# Patient Record
Sex: Female | Born: 1965
Health system: Southern US, Community
[De-identification: ages and names within clinical notes are randomized; demographics above are authoritative.]

## PROBLEM LIST (undated history)

## (undated) DIAGNOSIS — E119 Type 2 diabetes mellitus without complications: Secondary | ICD-10-CM

## (undated) DIAGNOSIS — O00109 Unspecified tubal pregnancy without intrauterine pregnancy: Secondary | ICD-10-CM

## (undated) HISTORY — PX: REPAIR NONUNION / DEFECT OF RADIUS / ULNA: SUR1192

## (undated) HISTORY — DX: Type 2 diabetes mellitus without complications: E11.9

## (undated) HISTORY — PX: KNEE ARTHROSCOPY W/ MENISCAL REPAIR: SHX1877

## (undated) HISTORY — PX: SALPINGECTOMY: SHX328

## (undated) HISTORY — DX: Unspecified tubal pregnancy without intrauterine pregnancy: O00.109

---

## 1997-10-12 DIAGNOSIS — O00109 Unspecified tubal pregnancy without intrauterine pregnancy: Secondary | ICD-10-CM

## 1997-10-12 HISTORY — DX: Unspecified tubal pregnancy without intrauterine pregnancy: O00.109

## 2005-12-08 ENCOUNTER — Encounter
Admission: RE | Admit: 2005-12-08 | Discharge: 2005-12-08 | Payer: Self-pay | Admitting: Physical Medicine and Rehabilitation

## 2013-08-15 ENCOUNTER — Encounter: Payer: Self-pay | Admitting: Nurse Practitioner

## 2013-08-15 ENCOUNTER — Other Ambulatory Visit: Payer: Self-pay | Admitting: Nurse Practitioner

## 2013-08-15 ENCOUNTER — Ambulatory Visit (INDEPENDENT_AMBULATORY_CARE_PROVIDER_SITE_OTHER): Payer: Self-pay | Admitting: Nurse Practitioner

## 2013-08-15 VITALS — BP 104/80 | HR 86 | Temp 97.8°F | Ht 63.0 in | Wt 174.2 lb

## 2013-08-15 DIAGNOSIS — Z0000 Encounter for general adult medical examination without abnormal findings: Secondary | ICD-10-CM

## 2013-08-15 DIAGNOSIS — E119 Type 2 diabetes mellitus without complications: Secondary | ICD-10-CM

## 2013-08-15 DIAGNOSIS — E1110 Type 2 diabetes mellitus with ketoacidosis without coma: Secondary | ICD-10-CM

## 2013-08-15 MED ORDER — METFORMIN HCL 1000 MG PO TABS: 1000.0000 mg | ORAL_TABLET | Freq: Two times a day (BID) | ORAL | Status: DC

## 2013-08-15 NOTE — Progress Notes (Signed)
Subjective:     Rose Price is a 47 y.o. female and is here for a comprehensive physical exam. The patient reports recent diagnosis of diabetes. She has been on metformin for about 8-12 weeks.Marland Kitchen  History   Social History  . Marital Status: Married    Spouse Name: N/A    Number of Children: 2  . Years of Education: N/A   Occupational History  . Phelbotomist    Social History Main Topics  . Smoking status: Never Smoker   . Smokeless tobacco: Never Used  . Alcohol Use: No  . Drug Use: No  . Sexual Activity: Yes   Other Topics Concern  . Not on file   Social History Narrative  . No narrative on file   No health maintenance topics applied.  The following portions of the patient's history were reviewed and updated as appropriate: allergies, current medications, past family history, past medical history, past social history, past surgical history and problem list.  Review of Systems Constitutional: negative Eyes: negative Ears, nose, mouth, throat, and face: negative Respiratory: negative Cardiovascular: negative Gastrointestinal: negative Genitourinary:missed 2 MC 6 mos ago. Mc have been regular since then. having perimenopausal symptoms such as hot flashes. Integument/breast: negative Musculoskeletal:negative Neurological: negative Behavioral/Psych: positive for sleep disturbance and takes 30 mg melatonin times 1 yr. Took ambien in past-has memory loss. Endocrine: negative for diabetic symptoms including blurry vision, polydipsia, polyphagia, polyuria and poor wound healing and temperature intolerance Allergic/Immunologic: negative   Objective:    BP 104/80  Pulse 86  Temp(Src) 97.8 F (36.6 C) (Oral)  Ht 5\' 3"  (1.6 m)  Wt 174 lb 4 oz (79.039 kg)  BMI 30.87 kg/m2  SpO2 98%  LMP 08/13/2013 General appearance: alert, cooperative, appears stated age and no distress Head: Normocephalic, without obvious abnormality, atraumatic Eyes: negative findings: lids and  lashes normal, conjunctivae and sclerae normal, corneas clear and pupils equal, round, reactive to light and accomodation Ears: normal TM's and external ear canals both ears Nose: Nares normal. Septum midline. Mucosa normal. No drainage or sinus tenderness. Throat: lips, mucosa, and tongue normal; teeth and gums normal Neck: no adenopathy, no carotid bruit, supple, symmetrical, trachea midline and thyroid not enlarged, symmetric, no tenderness/mass/nodules Lungs: clear to auscultation bilaterally Heart: regular rate and rhythm, S1, S2 normal, no murmur, click, rub or gallop Abdomen: soft, non-tender; bowel sounds normal; no masses,  no organomegaly Extremities: extremities normal, atraumatic, no cyanosis or edema, no edema, redness or tenderness in the calves or thighs, no ulcers, gangrene or trophic changes and full sensation in feet using monofilament Pulses: 2+ and symmetric Skin: Skin color, texture, turgor normal. No rashes or lesions or hsa solar lentigo epigastric region, center. Pt used to use tanning beds a lot, uses spray tans from time to time. Lymph nodes: Cervical, supraclavicular, and axillary nodes normal. Neurologic: Alert and oriented X 3, normal strength and tone. Normal symmetric reflexes. Normal coordination and gait    Assessment:    Healthy female exam. prev care: tdap 2013, declined flu & pneumococcal vaccine, Pap next yr-no nmls, last was 2013. New diagnosis of diabetes. Currently on metformin 1000 mg bid. Has made diet changes. INsomnia-using 30 mg melatonin qhs.       Plan:  Pap in 2016. Educated regarding need for vaccines in people w/chronic illness such as diabetes. Limit screening labs as pt does not have ins now. Diabetes: Continue metformin. HgbA1C, AST/ALT, Bmet. Will do lipids & microalb next visit. Gave info for s&s hypo &  hyperglycemia w/interventions, info for free classes thru [facility] [place] Insomnia- cautioned high dose melatonin, discussed realistic  expectations of sleep.    See After Visit Summary for Counseling Recommendations

## 2013-08-15 NOTE — Patient Instructions (Addendum)
Our office will call with lab results. Nice to see you!  Preventive Care for Adults, Female A healthy lifestyle and preventive care can promote health and wellness. Preventive health guidelines for women include the following key practices.  A routine yearly physical is a good way to check with your caregiver about your health and preventive screening. It is a chance to share any concerns and updates on your health, and to receive a thorough exam.  Visit your dentist for a routine exam and preventive care every 6 months. Brush your teeth twice a day and floss once a day. Good oral hygiene prevents tooth decay and gum disease.  The frequency of eye exams is based on your age, health, family medical history, use of contact lenses, and other factors. Follow your caregiver's recommendations for frequency of eye exams.  Eat a healthy diet. Foods like vegetables, fruits, whole grains, low-fat dairy products, and lean protein foods contain the nutrients you need without too many calories. Decrease your intake of foods high in solid fats, added sugars, and salt. Eat the right amount of calories for you.Get information about a proper diet from your caregiver, if necessary.  Regular physical exercise is one of the most important things you can do for your health. Most adults should get at least 150 minutes of moderate-intensity exercise (any activity that increases your heart rate and causes you to sweat) each week. In addition, most adults need muscle-strengthening exercises on 2 or more days a week.  Maintain a healthy weight. The body mass index (BMI) is a screening tool to identify possible weight problems. It provides an estimate of body fat based on height and weight. Your caregiver can help determine your BMI, and can help you achieve or maintain a healthy weight.For adults 20 years and older:  A BMI below 18.5 is considered underweight.  A BMI of 18.5 to 24.9 is normal.  A BMI of 25 to 29.9 is  considered overweight.  A BMI of 30 and above is considered obese.  Maintain normal blood lipids and cholesterol levels by exercising and minimizing your intake of saturated fat. Eat a balanced diet with plenty of fruit and vegetables. Blood tests for lipids and cholesterol should begin at age 75 and be repeated every 5 years. If your lipid or cholesterol levels are high, you are over 50, or you are at high risk for heart disease, you may need your cholesterol levels checked more frequently.Ongoing high lipid and cholesterol levels should be treated with medicines if diet and exercise are not effective.  If you smoke, find out from your caregiver how to quit. If you do not use tobacco, do not start.  If you are pregnant, do not drink alcohol. If you are breastfeeding, be very cautious about drinking alcohol. If you are not pregnant and choose to drink alcohol, do not exceed 1 drink per day. One drink is considered to be 12 ounces (355 mL) of beer, 5 ounces (148 mL) of wine, or 1.5 ounces (44 mL) of liquor.  Avoid use of street drugs. Do not share needles with anyone. Ask for help if you need support or instructions about stopping the use of drugs.  High blood pressure causes heart disease and increases the risk of stroke. Your blood pressure should be checked at least every 1 to 2 years. Ongoing high blood pressure should be treated with medicines if weight loss and exercise are not effective.  If you are 59 to 49 years old, ask  your caregiver if you should take aspirin to prevent strokes.  Diabetes screening involves taking a blood sample to check your fasting blood sugar level. This should be done once every 3 years, after age 81, if you are within normal weight and without risk factors for diabetes. Testing should be considered at a younger age or be carried out more frequently if you are overweight and have at least 1 risk factor for diabetes.  Breast cancer screening is essential preventive  care for women. You should practice "breast self-awareness." This means understanding the normal appearance and feel of your breasts and may include breast self-examination. Any changes detected, no matter how small, should be reported to a caregiver. Women in their 70s and 30s should have a clinical breast exam (CBE) by a caregiver as part of a regular health exam every 1 to 3 years. After age 44, women should have a CBE every year. Starting at age 30, women should consider having a mammography (breast X-ray test) every year. Women who have a family history of breast cancer should talk to their caregiver about genetic screening. Women at a high risk of breast cancer should talk to their caregivers about having magnetic resonance imaging (MRI) and a mammography every year.  The Pap test is a screening test for cervical cancer. A Pap test can show cell changes on the cervix that might become cervical cancer if left untreated. A Pap test is a procedure in which cells are obtained and examined from the lower end of the uterus (cervix).  Women should have a Pap test starting at age 13.  Between ages 20 and 87, Pap tests should be repeated every 2 years.  Beginning at age 21, you should have a Pap test every 3 years as long as the past 3 Pap tests have been normal.  Some women have medical problems that increase the chance of getting cervical cancer. Talk to your caregiver about these problems. It is especially important to talk to your caregiver if a new problem develops soon after your last Pap test. In these cases, your caregiver may recommend more frequent screening and Pap tests.  The above recommendations are the same for women who have or have not gotten the vaccine for human papillomavirus (HPV).  If you had a hysterectomy for a problem that was not cancer or a condition that could lead to cancer, then you no longer need Pap tests. Even if you no longer need a Pap test, a regular exam is a good idea  to make sure no other problems are starting.  If you are between ages 51 and 63, and you have had normal Pap tests going back 10 years, you no longer need Pap tests. Even if you no longer need a Pap test, a regular exam is a good idea to make sure no other problems are starting.  If you have had past treatment for cervical cancer or a condition that could lead to cancer, you need Pap tests and screening for cancer for at least 20 years after your treatment.  If Pap tests have been discontinued, risk factors (such as a new sexual partner) need to be reassessed to determine if screening should be resumed.  The HPV test is an additional test that may be used for cervical cancer screening. The HPV test looks for the virus that can cause the cell changes on the cervix. The cells collected during the Pap test can be tested for HPV. The HPV test could  be used to screen women aged 12 years and older, and should be used in women of any age who have unclear Pap test results. After the age of 54, women should have HPV testing at the same frequency as a Pap test.  Colorectal cancer can be detected and often prevented. Most routine colorectal cancer screening begins at the age of 62 and continues through age 47. However, your caregiver may recommend screening at an earlier age if you have risk factors for colon cancer. On a yearly basis, your caregiver may provide home test kits to check for hidden blood in the stool. Use of a small camera at the end of a tube, to directly examine the colon (sigmoidoscopy or colonoscopy), can detect the earliest forms of colorectal cancer. Talk to your caregiver about this at age 5, when routine screening begins. Direct examination of the colon should be repeated every 5 to 10 years through age 81, unless early forms of pre-cancerous polyps or small growths are found.  Hepatitis C blood testing is recommended for all people born from [birth year] through 1965 and any individual with known  risks for hepatitis C.  Practice safe sex. Use condoms and avoid high-risk sexual practices to reduce the spread of sexually transmitted infections (STIs). STIs include gonorrhea, chlamydia, syphilis, trichomonas, herpes, HPV, and human immunodeficiency virus (HIV). Herpes, HIV, and HPV are viral illnesses that have no cure. They can result in disability, cancer, and death. Sexually active women aged 30 and younger should be checked for chlamydia. Older women with new or multiple partners should also be tested for chlamydia. Testing for other STIs is recommended if you are sexually active and at increased risk.  Osteoporosis is a disease in which the bones lose minerals and strength with aging. This can result in serious bone fractures. The risk of osteoporosis can be identified using a bone density scan. Women ages 45 and over and women at risk for fractures or osteoporosis should discuss screening with their caregivers. Ask your caregiver whether you should take a calcium supplement or vitamin D to reduce the rate of osteoporosis.  Menopause can be associated with physical symptoms and risks. Hormone replacement therapy is available to decrease symptoms and risks. You should talk to your caregiver about whether hormone replacement therapy is right for you.  Use sunscreen with sun protection factor (SPF) of 30 or more. Apply sunscreen liberally and repeatedly throughout the day. You should seek shade when your shadow is shorter than you. Protect yourself by wearing long sleeves, pants, a wide-brimmed hat, and sunglasses year round, whenever you are outdoors.  Once a month, do a whole body skin exam, using a mirror to look at the skin on your back. Notify your caregiver of new moles, moles that have irregular borders, moles that are larger than a pencil eraser, or moles that have changed in shape or color.  Stay current with required immunizations.  Influenza. You need a dose every fall (or winter).  The composition of the flu vaccine changes each year, so being vaccinated once is not enough.  Pneumococcal polysaccharide. You need 1 to 2 doses if you smoke cigarettes or if you have certain chronic medical conditions. You need 1 dose at age 46 (or older) if you have never been vaccinated.  Tetanus, diphtheria, pertussis (Tdap, Td). Get 1 dose of Tdap vaccine if you are younger than age 58, are over 104 and have contact with an infant, are a Research scientist (physical sciences), are pregnant, or simply want  to be protected from whooping cough. After that, you need a Td booster dose every 10 years. Consult your caregiver if you have not had at least 3 tetanus and diphtheria-containing shots sometime in your life or have a deep or dirty wound.  HPV. You need this vaccine if you are a woman age 58 or younger. The vaccine is given in 3 doses over 6 months.  Measles, mumps, rubella (MMR). You need at least 1 dose of MMR if you were born in 1957 or later. You may also need a second dose.  Meningococcal. If you are age 25 to 38 and a first-year college student living in a residence hall, or have one of several medical conditions, you need to get vaccinated against meningococcal disease. You may also need additional booster doses.  Zoster (shingles). If you are age 37 or older, you should get this vaccine.  Varicella (chickenpox). If you have never had chickenpox or you were vaccinated but received only 1 dose, talk to your caregiver to find out if you need this vaccine.  Hepatitis A. You need this vaccine if you have a specific risk factor for hepatitis A virus infection or you simply wish to be protected from this disease. The vaccine is usually given as 2 doses, 6 to 18 months apart.  Hepatitis B. You need this vaccine if you have a specific risk factor for hepatitis B virus infection or you simply wish to be protected from this disease. The vaccine is given in 3 doses, usually over 6 months. Preventive Services /  Frequency Ages 58 to 56  Blood pressure check.** / Every 1 to 2 years.  Lipid and cholesterol check.** / Every 5 years beginning at age 12.  Clinical breast exam.** / Every 3 years for women in their 62s and 30s.  Pap test.** / Every 2 years from ages 39 through 44. Every 3 years starting at age 66 through age 34 or 50 with a history of 3 consecutive normal Pap tests.  HPV screening.** / Every 3 years from ages 70 through ages 40 to 15 with a history of 3 consecutive normal Pap tests.  Hepatitis C blood test.** / For any individual with known risks for hepatitis C.  Skin self-exam. / Monthly.  Influenza immunization.** / Every year.  Pneumococcal polysaccharide immunization.** / 1 to 2 doses if you smoke cigarettes or if you have certain chronic medical conditions.  Tetanus, diphtheria, pertussis (Tdap, Td) immunization. / A one-time dose of Tdap vaccine. After that, you need a Td booster dose every 10 years.  HPV immunization. / 3 doses over 6 months, if you are 40 and younger.  Measles, mumps, rubella (MMR) immunization. / You need at least 1 dose of MMR if you were born in 1957 or later. You may also need a second dose.  Meningococcal immunization. / 1 dose if you are age 35 to 8 and a first-year college student living in a residence hall, or have one of several medical conditions, you need to get vaccinated against meningococcal disease. You may also need additional booster doses.  Varicella immunization.** / Consult your caregiver.  Hepatitis A immunization.** / Consult your caregiver. 2 doses, 6 to 18 months apart.  Hepatitis B immunization.** / Consult your caregiver. 3 doses usually over 6 months. Ages 35 to 72  Blood pressure check.** / Every 1 to 2 years.  Lipid and cholesterol check.** / Every 5 years beginning at age 100.  Clinical breast exam.** / Every year  after age 79.  Mammogram.** / Every year beginning at age 77 and continuing for as long as you are in  good health. Consult with your caregiver.  Pap test.** / Every 3 years starting at age 79 through age 36 or 57 with a history of 3 consecutive normal Pap tests.  HPV screening.** / Every 3 years from ages 16 through ages 69 to 35 with a history of 3 consecutive normal Pap tests.  Fecal occult blood test (FOBT) of stool. / Every year beginning at age 57 and continuing until age 34. You may not need to do this test if you get a colonoscopy every 10 years.  Flexible sigmoidoscopy or colonoscopy.** / Every 5 years for a flexible sigmoidoscopy or every 10 years for a colonoscopy beginning at age 3 and continuing until age 22.  Hepatitis C blood test.** / For all people born from [birth year] through 1967 and any individual with known risks for hepatitis C.  Skin self-exam. / Monthly.  Influenza immunization.** / Every year.  Pneumococcal polysaccharide immunization.** / 1 to 2 doses if you smoke cigarettes or if you have certain chronic medical conditions.  Tetanus, diphtheria, pertussis (Tdap, Td) immunization.** / A one-time dose of Tdap vaccine. After that, you need a Td booster dose every 10 years.  Measles, mumps, rubella (MMR) immunization. / You need at least 1 dose of MMR if you were born in 1957 or later. You may also need a second dose.  Varicella immunization.** / Consult your caregiver.  Meningococcal immunization.** / Consult your caregiver.  Hepatitis A immunization.** / Consult your caregiver. 2 doses, 6 to 18 months apart.  Hepatitis B immunization.** / Consult your caregiver. 3 doses, usually over 6 months. Ages 50 and over  Blood pressure check.** / Every 1 to 2 years.  Lipid and cholesterol check.** / Every 5 years beginning at age 57.  Clinical breast exam.** / Every year after age 10.  Mammogram.** / Every year beginning at age 65 and continuing for as long as you are in good health. Consult with your caregiver.  Pap test.** / Every 3 years starting at age 3 through  age 52 or 64 with a 3 consecutive normal Pap tests. Testing can be stopped between 65 and 70 with 3 consecutive normal Pap tests and no abnormal Pap or HPV tests in the past 10 years.  HPV screening.** / Every 3 years from ages 28 through ages 12 or 61 with a history of 3 consecutive normal Pap tests. Testing can be stopped between 65 and 70 with 3 consecutive normal Pap tests and no abnormal Pap or HPV tests in the past 10 years.  Fecal occult blood test (FOBT) of stool. / Every year beginning at age 31 and continuing until age 51. You may not need to do this test if you get a colonoscopy every 10 years.  Flexible sigmoidoscopy or colonoscopy.** / Every 5 years for a flexible sigmoidoscopy or every 10 years for a colonoscopy beginning at age 64 and continuing until age 109.  Hepatitis C blood test.** / For all people born from [birth year] through 1967 and any individual with known risks for hepatitis C.  Osteoporosis screening.** / A one-time screening for women ages 25 and over and women at risk for fractures or osteoporosis.  Skin self-exam. / Monthly.  Influenza immunization.** / Every year.  Pneumococcal polysaccharide immunization.** / 1 dose at age 20 (or older) if you have never been vaccinated.  Tetanus, diphtheria, pertussis (Tdap, Td)  immunization. / A one-time dose of Tdap vaccine if you are over 65 and have contact with an infant, are a Research scientist (physical sciences), or simply want to be protected from whooping cough. After that, you need a Td booster dose every 10 years.  Varicella immunization.** / Consult your caregiver.  Meningococcal immunization.** / Consult your caregiver.  Hepatitis A immunization.** / Consult your caregiver. 2 doses, 6 to 18 months apart.  Hepatitis B immunization.** / Check with your caregiver. 3 doses, usually over 6 months. ** Family history and personal history of risk and conditions may change your caregiver's recommendations. Document Released: 11/24/2001 Document  Revised: 12/21/2011 Document Reviewed: 02/23/2011 [facility] Patient Information 2014 [place], [place].   Type 2 Diabetes Mellitus, Adult Type 2 diabetes mellitus, often simply referred to as type 2 diabetes, is a long-lasting (chronic) disease. In type 2 diabetes, the pancreas does not make enough insulin (a hormone), the cells are less responsive to the insulin that is made (insulin resistance), or both. Normally, insulin moves sugars from food into the tissue cells. The tissue cells use the sugars for energy. The lack of insulin or the lack of normal response to insulin causes excess sugars to build up in the blood instead of going into the tissue cells. As a result, high blood sugar (hyperglycemia) develops. The effect of high sugar (glucose) levels can cause many complications. Type 2 diabetes was also previously called adult-onset diabetes but it can occur at any age.  RISK FACTORS  A person is predisposed to developing type 2 diabetes if someone in the family has the disease and also has one or more of the following primary risk factors:  Overweight.  An inactive lifestyle.  A history of consistently eating high-calorie foods. Maintaining a normal weight and regular physical activity can reduce the chance of developing type 2 diabetes. SYMPTOMS  A person with type 2 diabetes may not show symptoms initially. The symptoms of type 2 diabetes appear slowly. The symptoms include:  Increased thirst (polydipsia).  Increased urination (polyuria).  Increased urination during the night (nocturia).  Weight loss. This weight loss may be rapid.  Frequent, recurring infections.  Tiredness (fatigue).  Weakness.  Vision changes, such as blurred vision.  Fruity smell to your breath.  Abdominal pain.  Nausea or vomiting.  Cuts or bruises which are slow to heal.  Tingling or numbness in the hands or feet. DIAGNOSIS Type 2 diabetes is frequently not diagnosed until complications of  diabetes are present. Type 2 diabetes is diagnosed when symptoms or complications are present and when blood glucose levels are increased. Your blood glucose level may be checked by one or more of the following blood tests:  A fasting blood glucose test. You will not be allowed to eat for at least 8 hours before a blood sample is taken.  A random blood glucose test. Your blood glucose is checked at any time of the day regardless of when you ate.  A hemoglobin A1c blood glucose test. A hemoglobin A1c test provides information about blood glucose control over the previous 3 months.  An oral glucose tolerance test (OGTT). Your blood glucose is measured after you have not eaten (fasted) for 2 hours and then after you drink a glucose-containing beverage. TREATMENT   You may need to take insulin or diabetes medicine daily to keep blood glucose levels in the desired range.  You will need to match insulin dosing with exercise and healthy food choices. The treatment goal is to maintain the before  meal blood sugar (preprandial glucose) level at 70 130 mg/dL. HOME CARE INSTRUCTIONS   Have your hemoglobin A1c level checked twice a year.  Perform daily blood glucose monitoring as directed by your caregiver.  Monitor urine ketones when you are ill and as directed by your caregiver.  Take your diabetes medicine or insulin as directed by your caregiver to maintain your blood glucose levels in the desired range.  Never run out of diabetes medicine or insulin. It is needed every day.  Adjust insulin based on your intake of carbohydrates. Carbohydrates can raise blood glucose levels but need to be included in your diet. Carbohydrates provide vitamins, minerals, and fiber which are an essential part of a healthy diet. Carbohydrates are found in fruits, vegetables, whole grains, dairy products, legumes, and foods containing added sugars.    Eat healthy foods. Alternate 3 meals with 3 snacks.  Lose weight  if overweight.  Carry a medical alert card or wear your medical alert jewelry.  Carry a 15 gram carbohydrate snack with you at all times to treat low blood glucose (hypoglycemia). Some examples of 15 gram carbohydrate snacks include:  Glucose tablets, 3 or 4   Glucose gel, 15 gram tube  Raisins, 2 tablespoons (24 grams)  Jelly beans, 6  Animal crackers, 8  Regular pop, 4 ounces (120 mL)  Gummy treats, 9  Recognize hypoglycemia. Hypoglycemia occurs with blood glucose levels of 70 mg/dL and below. The risk for hypoglycemia increases when fasting or skipping meals, during or after intense exercise, and during sleep. Hypoglycemia symptoms can include:  Tremors or shakes.  Decreased ability to concentrate.  Sweating.  Increased heart rate.  Headache.  Dry mouth.  Hunger.  Irritability.  Anxiety.  Restless sleep.  Altered speech or coordination.  Confusion.  Treat hypoglycemia promptly. If you are alert and able to safely swallow, follow the 15:15 rule:  Take 15 20 grams of rapid-acting glucose or carbohydrate. Rapid-acting options include glucose gel, glucose tablets, or 4 ounces (120 mL) of fruit juice, regular soda, or low fat milk.  Check your blood glucose level 15 minutes after taking the glucose.  Take 15 20 grams more of glucose if the repeat blood glucose level is still 70 mg/dL or below.  Eat a meal or snack within 1 hour once blood glucose levels return to normal.    Be alert to polyuria and polydipsia which are early signs of hyperglycemia. An early awareness of hyperglycemia allows for prompt treatment. Treat hyperglycemia as directed by your caregiver.  Engage in at least 150 minutes of moderate-intensity physical activity a week, spread over at least 3 days of the week or as directed by your caregiver. In addition, you should engage in resistance exercise at least 2 times a week or as directed by your caregiver.  Adjust your medicine and food  intake as needed if you start a new exercise or sport.  Follow your sick day plan at any time you are unable to eat or drink as usual.  Avoid tobacco use.  Limit alcohol intake to no more than 1 drink per day for nonpregnant women and 2 drinks per day for men. You should drink alcohol only when you are also eating food. Talk with your caregiver whether alcohol is safe for you. Tell your caregiver if you drink alcohol several times a week.  Follow up with your caregiver regularly.  Schedule an eye exam soon after the diagnosis of type 2 diabetes and then annually.  Perform daily  skin and foot care. Examine your skin and feet daily for cuts, bruises, redness, nail problems, bleeding, blisters, or sores. A foot exam by a caregiver should be done annually.  Brush your teeth and gums at least twice a day and floss at least once a day. Follow up with your dentist regularly.  Share your diabetes management plan with your workplace or school.  Stay up-to-date with immunizations.  Learn to manage stress.  Obtain ongoing diabetes education and support as needed.  Participate in, or seek rehabilitation as needed to maintain or improve independence and quality of life. Request a physical or occupational therapy referral if you are having foot or hand numbness or difficulties with grooming, dressing, eating, or physical activity. SEEK MEDICAL CARE IF:   You are unable to eat food or drink fluids for more than 6 hours.  You have nausea and vomiting for more than 6 hours.  Your blood glucose level is over 240 mg/dL.  There is a change in mental status.  You develop an additional serious illness.  You have diarrhea for more than 6 hours.  You have been sick or have had a fever for a couple of days and are not getting better.  You have pain during any physical activity.  SEEK IMMEDIATE MEDICAL CARE IF:  You have difficulty breathing.  You have moderate to large ketone levels. MAKE SURE  YOU:  Understand these instructions.  Will watch your condition.  Will get help right away if you are not doing well or get worse. Document Released: 09/28/2005 Document Revised: 06/22/2012 Document Reviewed: 04/26/2012 [facility] Patient Information 2014 [place], [place].

## 2013-08-16 ENCOUNTER — Other Ambulatory Visit: Payer: Self-pay | Admitting: Nurse Practitioner

## 2013-08-16 DIAGNOSIS — E1110 Type 2 diabetes mellitus with ketoacidosis without coma: Secondary | ICD-10-CM

## 2013-08-16 LAB — BASIC METABOLIC PANEL
Creat: 0.74 mg/dL (ref 0.50–1.10)
Glucose, Bld: 136 mg/dL — ABNORMAL HIGH (ref 70–99)
Potassium: 4.2 mEq/L (ref 3.5–5.3)
Sodium: 135 mEq/L (ref 135–145)

## 2013-08-16 LAB — HEMOGLOBIN A1C
Hgb A1c MFr Bld: 6.9 % — ABNORMAL HIGH (ref <5.7)
Mean Plasma Glucose: 151 mg/dL — ABNORMAL HIGH (ref <117)

## 2013-08-16 LAB — AST: AST: 15 U/L (ref 0–37)

## 2013-08-16 MED ORDER — METFORMIN HCL 500 MG PO TABS: 500.0000 mg | ORAL_TABLET | Freq: Two times a day (BID) | ORAL | Status: DC

## 2013-08-21 ENCOUNTER — Telehealth: Payer: Self-pay | Admitting: *Deleted

## 2013-08-21 ENCOUNTER — Telehealth: Payer: Self-pay | Admitting: Nurse Practitioner

## 2013-08-21 DIAGNOSIS — E1110 Type 2 diabetes mellitus with ketoacidosis without coma: Secondary | ICD-10-CM

## 2013-08-21 NOTE — Telephone Encounter (Signed)
Pt called requesting a return call from [employer].  Please advise

## 2013-08-21 NOTE — Telephone Encounter (Signed)
Hgb A1c much improved, will decrease metformin. LM for pt X 2.

## 2013-08-22 MED ORDER — METFORMIN HCL 1000 MG PO TABS: 1000.0000 mg | ORAL_TABLET | Freq: Two times a day (BID) | ORAL | Status: DC

## 2013-08-22 NOTE — Telephone Encounter (Signed)
HgbA1c dramatically improved w/metformin 1000mg  bid & diest changes. Will continue w/current Tx for 3 more months, then re-eval. Discussed w/pt. All questions answered.

## 2013-09-25 ENCOUNTER — Telehealth: Payer: Self-pay | Admitting: *Deleted

## 2013-09-25 NOTE — Telephone Encounter (Signed)
Patient is requesting a medication for her nerves(Xanax). Patient stated that due to her living situation her nerves stay tore-up. Patient stated that she just wants something to get her past the holidays or until she moves out in two months. I explained that she would probably have to be seen in the office and patient said that she can not afford to an ov. Please advise?

## 2013-09-26 NOTE — Telephone Encounter (Signed)
Please explain to pt that I will not prescribe medicine without working up a diagnosis. I will be happy to refer her to [place] [facility] if she needs to talk to someone about her circumstances or she may make an appointment with me.

## 2013-09-26 NOTE — Telephone Encounter (Signed)
LMOVM for patient to return call.

## 2013-09-26 NOTE — Telephone Encounter (Signed)
Patient returned call and scheduled appointment to see Layne.

## 2013-09-27 ENCOUNTER — Ambulatory Visit: Payer: Self-pay | Admitting: Nurse Practitioner

## 2013-09-28 ENCOUNTER — Ambulatory Visit: Payer: Self-pay | Admitting: Nurse Practitioner

## 2013-11-15 ENCOUNTER — Ambulatory Visit: Payer: Self-pay | Admitting: Nurse Practitioner

## 2013-12-06 ENCOUNTER — Encounter (HOSPITAL_BASED_OUTPATIENT_CLINIC_OR_DEPARTMENT_OTHER): Payer: Self-pay | Admitting: Emergency Medicine

## 2013-12-06 ENCOUNTER — Emergency Department (HOSPITAL_BASED_OUTPATIENT_CLINIC_OR_DEPARTMENT_OTHER)
Admission: EM | Admit: 2013-12-06 | Discharge: 2013-12-06 | Disposition: A | Discharge status: Home / Self Care | Payer: Self-pay | Attending: Emergency Medicine | Admitting: Emergency Medicine

## 2013-12-06 DIAGNOSIS — M5430 Sciatica, unspecified side: Secondary | ICD-10-CM | POA: Insufficient documentation

## 2013-12-06 DIAGNOSIS — R209 Unspecified disturbances of skin sensation: Secondary | ICD-10-CM | POA: Insufficient documentation

## 2013-12-06 DIAGNOSIS — M5431 Sciatica, right side: Secondary | ICD-10-CM

## 2013-12-06 DIAGNOSIS — E119 Type 2 diabetes mellitus without complications: Secondary | ICD-10-CM | POA: Insufficient documentation

## 2013-12-06 DIAGNOSIS — Z79899 Other long term (current) drug therapy: Secondary | ICD-10-CM | POA: Insufficient documentation

## 2013-12-06 MED ORDER — OXYCODONE-ACETAMINOPHEN 5-325 MG PO TABS: 1.0000 | ORAL_TABLET | Freq: Four times a day (QID) | ORAL | Status: DC | PRN

## 2013-12-06 MED ORDER — HYDROMORPHONE HCL PF 1 MG/ML IJ SOLN
1.0000 mg | Freq: Once | INTRAMUSCULAR | Status: AC
  Administered 2013-12-06: 1 mg via INTRAMUSCULAR
  Filled 2013-12-06: qty 1

## 2013-12-06 MED ORDER — DIAZEPAM 5 MG PO TABS: 5.0000 mg | ORAL_TABLET | Freq: Three times a day (TID) | ORAL | Status: DC | PRN

## 2013-12-06 MED ORDER — CYCLOBENZAPRINE HCL 10 MG PO TABS
10.0000 mg | ORAL_TABLET | Freq: Once | ORAL | Status: AC
  Administered 2013-12-06: 10 mg via ORAL
  Filled 2013-12-06: qty 1

## 2013-12-06 MED ORDER — OXYCODONE-ACETAMINOPHEN 5-325 MG PO TABS
2.0000 | ORAL_TABLET | Freq: Once | ORAL | Status: AC
Start: 2013-12-06 — End: 2013-12-06
  Administered 2013-12-06: 2 via ORAL
  Filled 2013-12-06: qty 2

## 2013-12-06 MED ORDER — KETOROLAC TROMETHAMINE 60 MG/2ML IM SOLN
60.0000 mg | Freq: Once | INTRAMUSCULAR | Status: AC
  Administered 2013-12-06: 60 mg via INTRAMUSCULAR
  Filled 2013-12-06: qty 2

## 2013-12-06 MED ORDER — ONDANSETRON 4 MG PO TBDP
4.0000 mg | ORAL_TABLET | Freq: Once | ORAL | Status: AC
  Administered 2013-12-06: 4 mg via ORAL
  Filled 2013-12-06: qty 1

## 2013-12-06 NOTE — ED Notes (Signed)
Pt has a ride- rx x 2 given

## 2013-12-06 NOTE — Discharge Instructions (Signed)
Back Exercises Back exercises help treat and prevent back injuries. The goal of back exercises is to increase the strength of your abdominal and back muscles and the flexibility of your back. These exercises should be started when you no longer have back pain. Back exercises include:  Pelvic Tilt. Lie on your back with your knees bent. Tilt your pelvis until the lower part of your back is against the floor. Hold this position 5 to 10 sec and repeat 5 to 10 times.  Knee to Chest. Pull first 1 knee up against your chest and hold for 20 to 30 seconds, repeat this with the other knee, and then both knees. This may be done with the other leg straight or bent, whichever feels better.  Sit-Ups or Curl-Ups. Bend your knees 90 degrees. Start with tilting your pelvis, and do a partial, slow sit-up, lifting your trunk only 30 to 45 degrees off the floor. Take at least 2 to 3 seconds for each sit-up. Do not do sit-ups with your knees out straight. If partial sit-ups are difficult, simply do the above but with only tightening your abdominal muscles and holding it as directed.  Hip-Lift. Lie on your back with your knees flexed 90 degrees. Push down with your feet and shoulders as you raise your hips a couple inches off the floor; hold for 10 seconds, repeat 5 to 10 times.  Back arches. Lie on your stomach, propping yourself up on bent elbows. Slowly press on your hands, causing an arch in your low back. Repeat 3 to 5 times. Any initial stiffness and discomfort should lessen with repetition over time.  Shoulder-Lifts. Lie face down with arms beside your body. Keep hips and torso pressed to floor as you slowly lift your head and shoulders off the floor. Do not overdo your exercises, especially in the beginning. Exercises may cause you some mild back discomfort which lasts for a few minutes; however, if the pain is more severe, or lasts for more than 15 minutes, do not continue exercises until you see your caregiver.  Improvement with exercise therapy for back problems is slow.  See your caregivers for assistance with developing a proper back exercise program. Document Released: 11/05/2004 Document Revised: 12/21/2011 Document Reviewed: 07/30/2011 [employer] Patient Information 2014 [employer].   Sciatica Sciatica is pain, weakness, numbness, or tingling along the path of the sciatic nerve. The nerve starts in the lower back and runs down the back of each leg. The nerve controls the muscles in the lower leg and in the back of the knee, while also providing sensation to the back of the thigh, lower leg, and the sole of your foot. Sciatica is a symptom of another medical condition. For instance, nerve damage or certain conditions, such as a herniated disk or bone spur on the spine, pinch or put pressure on the sciatic nerve. This causes the pain, weakness, or other sensations normally associated with sciatica. Generally, sciatica only affects one side of the body. CAUSES   Herniated or slipped disc.  Degenerative disk disease.  A pain disorder involving the narrow muscle in the buttocks (piriformis syndrome).  Pelvic injury or fracture.  Pregnancy.  Tumor (rare). SYMPTOMS  Symptoms can vary from mild to very severe. The symptoms usually travel from the low back to the buttocks and down the back of the leg. Symptoms can include:  Mild tingling or dull aches in the lower back, leg, or hip.  Numbness in the back of the calf or sole   of the foot.  Burning sensations in the lower back, leg, or hip.  Sharp pains in the lower back, leg, or hip.  Leg weakness.  Severe back pain inhibiting movement. These symptoms may get worse with coughing, sneezing, laughing, or prolonged sitting or standing. Also, being overweight may worsen symptoms. DIAGNOSIS  Your caregiver will perform a physical exam to look for common symptoms of sciatica. He or she may ask you to do certain movements or activities that would  trigger sciatic nerve pain. Other tests may be performed to find the cause of the sciatica. These may include:  Blood tests.  X-rays.  Imaging tests, such as an MRI or CT scan. TREATMENT  Treatment is directed at the cause of the sciatic pain. Sometimes, treatment is not necessary and the pain and discomfort goes away on its own. If treatment is needed, your caregiver may suggest:  Over-the-counter medicines to relieve pain.  Prescription medicines, such as anti-inflammatory medicine, muscle relaxants, or narcotics.  Applying heat or ice to the painful area.  Steroid injections to lessen pain, irritation, and inflammation around the nerve.  Reducing activity during periods of pain.  Exercising and stretching to strengthen your abdomen and improve flexibility of your spine. Your caregiver may suggest losing weight if the extra weight makes the back pain worse.  Physical therapy.  Surgery to eliminate what is pressing or pinching the nerve, such as a bone spur or part of a herniated disk. HOME CARE INSTRUCTIONS   Only take over-the-counter or prescription medicines for pain or discomfort as directed by your caregiver.  Apply ice to the affected area for 20 minutes, 3 4 times a day for the first 48 72 hours. Then try heat in the same way.  Exercise, stretch, or perform your usual activities if these do not aggravate your pain.  Attend physical therapy sessions as directed by your caregiver.  Keep all follow-up appointments as directed by your caregiver.  Do not wear high heels or shoes that do not provide proper support.  Check your mattress to see if it is too soft. A firm mattress may lessen your pain and discomfort. SEEK IMMEDIATE MEDICAL CARE IF:   You lose control of your bowel or bladder (incontinence).  You have increasing weakness in the lower back, pelvis, buttocks, or legs.  You have redness or swelling of your back.  You have a burning sensation when you  urinate.  You have pain that gets worse when you lie down or awakens you at night.  Your pain is worse than you have experienced in the past.  Your pain is lasting longer than 4 weeks.  You are suddenly losing weight without reason. MAKE SURE YOU:  Understand these instructions.  Will watch your condition.  Will get help right away if you are not doing well or get worse. Document Released: 09/22/2001 Document Revised: 03/29/2012 Document Reviewed: 02/07/2012 [employer] Patient Information 2014 [employer].  

## 2013-12-06 NOTE — ED Provider Notes (Signed)
CSN: [id number]     Arrival date & time 12/06/13  0827 History   First MD Initiated Contact with Patient 12/06/13 0831     Chief Complaint  Patient presents with  . Back Pain    believes sciatica is flaring up     (Consider location/radiation/quality/duration/timing/severity/associated sxs/prior Treatment) Patient is a 48 y.o. female presenting with back pain. The history is provided by the patient.  Back Pain Location:  Lumbar spine Quality:  Stabbing and shooting Radiates to:  R posterior upper leg Pain severity:  Severe Pain is:  Same all the time Onset quality:  Gradual Duration:  2 weeks Timing:  Constant Progression:  Worsening Chronicity:  Recurrent Context comment:  Prior injury with disc tear at L4-L5 Relieved by:  Nothing Worsened by:  Ambulation, bending, twisting and movement Associated symptoms: tingling   Associated symptoms: no abdominal pain, no bladder incontinence, no bowel incontinence, no dysuria, no fever, no paresthesias, no perianal numbness and no weakness     Past Medical History  Diagnosis Date  . Diabetes mellitus without complication   . Tubal ectopic pregnancy 1999    L fallopian tube removed   Past Surgical History  Procedure Laterality Date  . Repair nonunion / defect of radius / ulna Right     has hardware  . Knee arthroscopy w/ meniscal repair Right   . Salpingectomy Right    Family History  Problem Relation Age of Onset  . Arthritis Mother   . Hyperlipidemia Mother   . Leukemia Father   . Irritable bowel syndrome Sister   . Diabetes Sister   . Anxiety disorder Sister   . Cancer Paternal Aunt     ovarian   History  Substance Use Topics  . Smoking status: Never Smoker   . Smokeless tobacco: Never Used  . Alcohol Use: No   OB History   Grav Para Term Preterm Abortions TAB SAB Ect Mult Living                 Review of Systems  Constitutional: Negative for fever.  Respiratory: Negative for cough and shortness of breath.    Gastrointestinal: Negative for abdominal pain and bowel incontinence.  Genitourinary: Negative for bladder incontinence, dysuria and difficulty urinating.       No saddle anesthesia, no incontinence or urinary retention  Musculoskeletal: Positive for back pain.  Neurological: Positive for tingling. Negative for weakness and paresthesias.  All other systems reviewed and are negative.      Allergies  Codeine  Home Medications   Current Outpatient Rx  Name  Route  Sig  Dispense  Refill  . B Complex-Biotin-FA (B-COMPLEX PO)   Oral   Take by mouth daily.         Marland Kitchen MAGNESIUM PO   Oral   Take by mouth daily.         . metFORMIN (GLUCOPHAGE) 1000 MG tablet   Oral   Take 1 tablet (1,000 mg total) by mouth 2 (two) times daily with a meal.   60 tablet   2   . PRESCRIPTION MEDICATION      HCG (pregnancy hormone) Take 1 tablet under the tongue daily.         . psyllium (METAMUCIL) 58.6 % powder   Oral   Take 1 packet by mouth daily.          BP 153/85  Pulse 92  Temp(Src) 98.1 F (36.7 C) (Oral)  Resp 18  Ht 5\' 3"  (1.6  m)  Wt 171 lb (77.565 kg)  BMI 30.30 kg/m2  SpO2 99%  LMP 11/07/2013 Physical Exam  Nursing note and vitals reviewed. Constitutional: She is oriented to person, place, and time. She appears well-developed and well-nourished. No distress.  HENT:  Head: Normocephalic and atraumatic.  Eyes: EOM are normal. Pupils are equal, round, and reactive to light.  Neck: Normal range of motion. Neck supple.  Cardiovascular: Normal rate and regular rhythm.  Exam reveals no friction rub.   No murmur heard. Pulmonary/Chest: Effort normal and breath sounds normal. No respiratory distress. She has no wheezes. She has no rales.  Abdominal: Soft. She exhibits no distension. There is no tenderness. There is no rebound.  Musculoskeletal: Normal range of motion. She exhibits no edema.  Neurological: She is alert and oriented to person, place, and time. No cranial  nerve deficit or sensory deficit. She exhibits normal muscle tone. GCS eye subscore is 4. GCS verbal subscore is 5. GCS motor subscore is 6.  Reflex Scores:      Patellar reflexes are 2+ on the right side. Skin: No rash noted. She is not diaphoretic. No pallor.    ED Course  Procedures (including critical care time) Labs Review Labs Reviewed - No data to display Imaging Review No results found.  EKG Interpretation   None       MDM   Final diagnoses:  Right sided sciatica    85F with R sided sciatica pain. Hx of similar, never had it this bad before. Denies fevers, urinary/bowel incontinence/retention. No saddle anesthesia. R leg tingling without overt numbness. Here AFVSS. Patient uncomfortable. Pain starting in R buttock, shooting down R posterior thigh and into R lower back.  No lumbar spine tenderness on exam. Normal R patellar reflex, normal strength and sensation in RLE. Will give IM dilaudid and PO flexeril. Given 2nd dose of IM dilaudid and toradol after re-exam. States pain improving, but still having a lot pain. Patient still in pain, given percocet to help with long term control. Stable for discharge, given valium, percocet, PCP f/u.  Dagmar HaitWilliam Ahmyah Gidley, MD 12/06/13 [phone]

## 2013-12-06 NOTE — ED Notes (Signed)
MD at bedside. 

## 2013-12-06 NOTE — ED Notes (Signed)
Right side back pain radiating down right leg has been off and on for 2 weeks last 2 days getting much worse pt drove herself in

## 2014-01-08 ENCOUNTER — Encounter (HOSPITAL_COMMUNITY): Payer: Self-pay | Admitting: Emergency Medicine

## 2014-01-08 ENCOUNTER — Emergency Department (HOSPITAL_COMMUNITY): Payer: 59

## 2014-01-08 ENCOUNTER — Emergency Department (HOSPITAL_COMMUNITY)
Admission: EM | Admit: 2014-01-08 | Discharge: 2014-01-08 | Disposition: A | Discharge status: Home / Self Care | Payer: 59 | Attending: Emergency Medicine | Admitting: Emergency Medicine

## 2014-01-08 DIAGNOSIS — R002 Palpitations: Secondary | ICD-10-CM | POA: Insufficient documentation

## 2014-01-08 DIAGNOSIS — N390 Urinary tract infection, site not specified: Secondary | ICD-10-CM | POA: Insufficient documentation

## 2014-01-08 DIAGNOSIS — Z79899 Other long term (current) drug therapy: Secondary | ICD-10-CM | POA: Insufficient documentation

## 2014-01-08 DIAGNOSIS — Z794 Long term (current) use of insulin: Secondary | ICD-10-CM | POA: Insufficient documentation

## 2014-01-08 DIAGNOSIS — E119 Type 2 diabetes mellitus without complications: Secondary | ICD-10-CM | POA: Insufficient documentation

## 2014-01-08 LAB — URINALYSIS, ROUTINE W REFLEX MICROSCOPIC
BILIRUBIN URINE: NEGATIVE
GLUCOSE, UA: 100 mg/dL — AB
HGB URINE DIPSTICK: NEGATIVE
KETONES UR: 15 mg/dL — AB
Leukocytes, UA: NEGATIVE
Nitrite: POSITIVE — AB
PH: 6 (ref 5.0–8.0)
Protein, ur: NEGATIVE mg/dL
Specific Gravity, Urine: >1.03 — ABNORMAL HIGH (ref 1.005–1.030)
Urobilinogen, UA: 2 mg/dL — ABNORMAL HIGH (ref 0.0–1.0)

## 2014-01-08 LAB — BASIC METABOLIC PANEL
BUN: 12 mg/dL (ref 6–23)
CO2: 26 mEq/L (ref 19–32)
CREATININE: 0.77 mg/dL (ref 0.50–1.10)
Calcium: 9.9 mg/dL (ref 8.4–10.5)
Chloride: 96 mEq/L (ref 96–112)
GFR calc Af Amer: >90 mL/min (ref >90)
GFR calc non Af Amer: >90 mL/min (ref >90)
Glucose, Bld: 255 mg/dL — ABNORMAL HIGH (ref 70–99)
POTASSIUM: 4.3 meq/L (ref 3.7–5.3)
SODIUM: 138 meq/L (ref 137–147)

## 2014-01-08 LAB — URINE MICROSCOPIC-ADD ON

## 2014-01-08 LAB — D-DIMER, QUANTITATIVE: D-Dimer, Quant: <0.27 ug/mL-FEU (ref 0.00–0.48)

## 2014-01-08 LAB — CBC WITH DIFFERENTIAL/PLATELET
Basophils Absolute: 0 10*3/uL (ref 0.0–0.1)
Basophils Relative: 0 % (ref 0–1)
EOS PCT: 1 % (ref 0–5)
Eosinophils Absolute: 0.1 10*3/uL (ref 0.0–0.7)
HEMATOCRIT: 37.4 % (ref 36.0–46.0)
HEMOGLOBIN: 12.8 g/dL (ref 12.0–15.0)
LYMPHS PCT: 21 % (ref 12–46)
Lymphs Abs: 1.9 10*3/uL (ref 0.7–4.0)
MCH: 30.5 pg (ref 26.0–34.0)
MCHC: 34.2 g/dL (ref 30.0–36.0)
MCV: 89 fL (ref 78.0–100.0)
Monocytes Absolute: 0.4 10*3/uL (ref 0.1–1.0)
Monocytes Relative: 5 % (ref 3–12)
Neutro Abs: 6.7 10*3/uL (ref 1.7–7.7)
Neutrophils Relative %: 73 % (ref 43–77)
PLATELETS: 304 10*3/uL (ref 150–400)
RBC: 4.2 MIL/uL (ref 3.87–5.11)
RDW: 12.5 % (ref 11.5–15.5)
WBC: 9.2 10*3/uL (ref 4.0–10.5)

## 2014-01-08 LAB — TROPONIN I: Troponin I: <0.3 ng/mL (ref <0.30)

## 2014-01-08 LAB — MAGNESIUM: Magnesium: 1.6 mg/dL (ref 1.5–2.5)

## 2014-01-08 LAB — PREGNANCY, URINE: PREG TEST UR: NEGATIVE

## 2014-01-08 MED ORDER — SODIUM CHLORIDE 0.9 % IV BOLUS (SEPSIS)
1000.0000 mL | Freq: Once | INTRAVENOUS | Status: AC
  Administered 2014-01-08: 1000 mL via INTRAVENOUS

## 2014-01-08 MED ORDER — CEPHALEXIN 500 MG PO CAPS: 500.0000 mg | ORAL_CAPSULE | Freq: Four times a day (QID) | ORAL | Status: DC

## 2014-01-08 NOTE — ED Provider Notes (Signed)
CSN: [id number]     Arrival date & time 01/08/14  1349 History   First MD Initiated Contact with Patient 01/08/14 1415     Chief Complaint  Patient presents with  . Palpitations     HPI Pt was seen at 1425. Per pt, c/o gradual onset and persistence of multiple intermittent episodes of palpitations for the past 1 month. Has been associated with CP, SOB and "feeling flushed." Describes the palpitations as "it feels like my heart is fluttering." States her episode today began approx 1330 PTA. Pt has intermittently taken her HR during these episodes and it is "100's and 110's." Pt states she has been evaluated by her PMD for same, and is scheduled to be evaluated by a Cardiologist. Pt states her symptoms occur "when I get upset." Endorses recent significant anxiety over both work and job "stresses." Denies SI/SA, no HI, no A/V hallucinations. Denies abd pain, no N/V/D, no back pain, no fevers, no rash, no cough.    Past Medical History  Diagnosis Date  . Diabetes mellitus without complication   . Tubal ectopic pregnancy 1999    L fallopian tube removed   Past Surgical History  Procedure Laterality Date  . Repair nonunion / defect of radius / ulna Right     has hardware  . Knee arthroscopy w/ meniscal repair Right   . Salpingectomy Right    Family History  Problem Relation Age of Onset  . Arthritis Mother   . Hyperlipidemia Mother   . Leukemia Father   . Irritable bowel syndrome Sister   . Diabetes Sister   . Anxiety disorder Sister   . Cancer Paternal Aunt     ovarian   History  Substance Use Topics  . Smoking status: Never Smoker   . Smokeless tobacco: Never Used  . Alcohol Use: No    Review of Systems ROS: Statement: All systems negative except as marked or noted in the HPI; Constitutional: Negative for fever and chills. ; ; Eyes: Negative for eye pain, redness and discharge. ; ; ENMT: Negative for ear pain, hoarseness, nasal congestion, sinus pressure and sore throat. ; ;  Cardiovascular: +palpitations, CP, SOB. Negative for diaphoresis, and peripheral edema. ; ; Respiratory: Negative for cough, wheezing and stridor. ; ; Gastrointestinal: Negative for nausea, vomiting, diarrhea, abdominal pain, blood in stool, hematemesis, jaundice and rectal bleeding. . ; ; Genitourinary: Negative for dysuria, flank pain and hematuria. ; ; Musculoskeletal: Negative for back pain and neck pain. Negative for swelling and trauma.; ; Skin: Negative for pruritus, rash, abrasions, blisters, bruising and skin lesion.; ; Neuro: Negative for headache, lightheadedness and neck stiffness. Negative for weakness, altered level of consciousness , altered mental status, extremity weakness, paresthesias, involuntary movement, seizure and syncope.; Psych:  +anxiety. No SI, no SA, no HI, no hallucinations.     Allergies  Codeine  Home Medications   Current Outpatient Rx  Name  Route  Sig  Dispense  Refill  . diphenhydramine-acetaminophen (TYLENOL PM) 25-500 MG TABS   Oral   Take 1 tablet by mouth at bedtime as needed (for sleep).         . insulin detemir (LEVEMIR) 100 UNIT/ML injection   Subcutaneous   Inject 10 Units into the skin at bedtime.         . Melatonin 10 MG CAPS   Oral   Take 1 capsule by mouth daily as needed (for sleep.).         Marland Kitchen metFORMIN (GLUCOPHAGE) 1000 MG  tablet   Oral   Take 1 tablet (1,000 mg total) by mouth 2 (two) times daily with a meal.   60 tablet   2   . Multiple Vitamins-Minerals (HAIR/SKIN/NAILS) TABS   Oral   Take 2 tablets by mouth daily.         Marland Kitchen. oxyCODONE-acetaminophen (PERCOCET/ROXICET) 5-325 MG per tablet   Oral   Take 1 tablet by mouth every 6 (six) hours as needed for severe pain.   30 tablet   0   . phentermine (ADIPEX-P) 37.5 MG tablet   Oral   Take 18.75 mg by mouth daily as needed (for appetite).         . Vitamin D, Ergocalciferol, (DRISDOL) 50000 UNITS CAPS capsule   Oral   Take 50,000 Units by mouth every 7 (seven)  days.         Marland Kitchen. zolpidem (AMBIEN) 10 MG tablet   Oral   Take 10 mg by mouth at bedtime as needed for sleep.          BP 129/90  Pulse 118  Temp(Src) 98.1 F (36.7 C) (Oral)  Resp 20  Ht 5\' 3"  (1.6 m)  Wt 170 lb (77.111 kg)  BMI 30.12 kg/m2  SpO2 100%  LMP 12/09/2013 Physical Exam 1430: Physical examination:  Nursing notes reviewed; Vital signs and O2 SAT reviewed;  Constitutional: Well developed, Well nourished, Well hydrated, In no acute distress; Head:  Normocephalic, atraumatic; Eyes: EOMI, PERRL, No scleral icterus; ENMT: Mouth and pharynx normal, Mucous membranes moist; Neck: Supple, Full range of motion, No lymphadenopathy; Cardiovascular: Regular rate and rhythm, HR 90's during my exam. No murmur, rub, or gallop; Respiratory: Breath sounds clear & equal bilaterally, No rales, rhonchi, wheezes.  Speaking full sentences with ease, Normal respiratory effort/excursion; Chest: Nontender, Movement normal; Abdomen: Soft, Nontender, Nondistended, Normal bowel sounds; Genitourinary: No CVA tenderness; Extremities: Pulses normal, No tenderness, No edema, No calf edema or asymmetry.; Neuro: AA&Ox3, Major CN grossly intact.  Speech clear. No gross focal motor or sensory deficits in extremities.; Skin: Color normal, Warm, Dry.; Psych:  Anxious.   ED Course  Procedures   1430:  Pt c/o palpitations during my exam: HR on monitor 90's/NSR. Pt informed. Workup pending.       EKG Interpretation   Date/Time:  Monday January 08 2014 13:59:24 EDT Ventricular Rate:  89 PR Interval:  130 QRS Duration: 84 QT Interval:  356 QTC Calculation: 433 R Axis:   60 Text Interpretation:  Normal sinus rhythm Normal ECG No previous ECGs  available Confirmed by [facility]CCMAN[facility]  MD, Nicholos JohnsKATHLEEN [phone]([id number]) on 01/08/2014 2:16:06  PM      MDM  MDM Reviewed: previous chart, nursing note and vitals Reviewed previous: labs and ECG Interpretation: labs, ECG and x-ray    Results for orders placed during the  hospital encounter of 01/08/14  CBC WITH DIFFERENTIAL      Result Value Ref Range   WBC 9.2  4.0 - 10.5 K/uL   RBC 4.20  3.87 - 5.11 MIL/uL   Hemoglobin 12.8  12.0 - 15.0 g/dL   HCT 19.137.4  47.836.0 - 29.546.0 %   MCV 89.0  78.0 - 100.0 fL   MCH 30.5  26.0 - 34.0 pg   MCHC 34.2  30.0 - 36.0 g/dL   RDW 62.112.5  30.811.5 - 65.715.5 %   Platelets 304  150 - 400 K/uL   Neutrophils Relative % 73  43 - 77 %   Neutro Abs 6.7  1.7 -  7.7 K/uL   Lymphocytes Relative 21  12 - 46 %   Lymphs Abs 1.9  0.7 - 4.0 K/uL   Monocytes Relative 5  3 - 12 %   Monocytes Absolute 0.4  0.1 - 1.0 K/uL   Eosinophils Relative 1  0 - 5 %   Eosinophils Absolute 0.1  0.0 - 0.7 K/uL   Basophils Relative 0  0 - 1 %   Basophils Absolute 0.0  0.0 - 0.1 K/uL  BASIC METABOLIC PANEL      Result Value Ref Range   Sodium 138  137 - 147 mEq/L   Potassium 4.3  3.7 - 5.3 mEq/L   Chloride 96  96 - 112 mEq/L   CO2 26  19 - 32 mEq/L   Glucose, Bld 255 (*) 70 - 99 mg/dL   BUN 12  6 - 23 mg/dL   Creatinine, Ser 9.60  0.50 - 1.10 mg/dL   Calcium 9.9  8.4 - 45.4 mg/dL   GFR calc non Af Amer >90  >90 mL/min   GFR calc Af Amer >90  >90 mL/min  TROPONIN I      Result Value Ref Range   Troponin I <0.30  <0.30 ng/mL  PREGNANCY, URINE      Result Value Ref Range   Preg Test, Ur NEGATIVE  NEGATIVE  URINALYSIS, ROUTINE W REFLEX MICROSCOPIC      Result Value Ref Range   Color, Urine YELLOW  YELLOW   APPearance CLEAR  CLEAR   Specific Gravity, Urine >1.030 (*) 1.005 - 1.030   pH 6.0  5.0 - 8.0   Glucose, UA 100 (*) NEGATIVE mg/dL   Hgb urine dipstick NEGATIVE  NEGATIVE   Bilirubin Urine NEGATIVE  NEGATIVE   Ketones, ur 15 (*) NEGATIVE mg/dL   Protein, ur NEGATIVE  NEGATIVE mg/dL   Urobilinogen, UA 2.0 (*) 0.0 - 1.0 mg/dL   Nitrite POSITIVE (*) NEGATIVE   Leukocytes, UA NEGATIVE  NEGATIVE  D-DIMER, QUANTITATIVE      Result Value Ref Range   D-Dimer, Quant <0.27  0.00 - 0.48 ug/mL-FEU  MAGNESIUM      Result Value Ref Range   Magnesium 1.6   1.5 - 2.5 mg/dL  TROPONIN I      Result Value Ref Range   Troponin I <0.30  <0.30 ng/mL  URINE MICROSCOPIC-ADD ON      Result Value Ref Range   Squamous Epithelial / LPF RARE  RARE   WBC, UA 0-2  <3 WBC/hpf   Bacteria, UA MANY (*) RARE   Dg Chest Portable 1 View 01/08/2014   CLINICAL DATA:  Chest pain  EXAM: PORTABLE CHEST - 1 VIEW  COMPARISON:  None.  FINDINGS: Lungs are clear. Heart size and pulmonary vascularity are normal. No adenopathy. No pneumothorax. No bone lesions.  IMPRESSION: No abnormality noted.   Electronically Signed   By: Bretta Bang M.D.   On: 01/08/2014 14:12    2030:  CBG elevated per hx DM, but pt is not acidotic. 2nd troponin negative. EKG without acute changes. Doubt ACS as cause for symptoms. Doubt PE as cause for symptoms with normal d-dimer. Pt's HR continues 80-90's while in the ED, monitor remains NSR, no ectopy. Pt however continues to c/o palpitations. Pt reassured. Pt states she wants to go home now. Dx and testing d/w pt.  Questions answered.  Verb understanding, agreeable to d/c home with outpt f/u.   Laray Anger, DO 01/10/14 2102

## 2014-01-08 NOTE — Discharge Instructions (Signed)
°Emergency Department Resource Guide °1) Find a Doctor and Pay Out of Pocket °Although you won't have to find out who is covered by your insurance plan, it is a good idea to ask around and get recommendations. You will then need to call the office and see if the doctor you have chosen will accept you as a new patient and what types of options they offer for patients who are self-pay. Some doctors offer discounts or will set up payment plans for their patients who do not have insurance, but you will need to ask so you aren't surprised when you get to your appointment. ° °2) Contact Your Local Health Department °Not all health departments have doctors that can see patients for sick visits, but many do, so it is worth a call to see if yours does. If you don't know where your local health department is, you can check in your phone book. The CDC also has a tool to help you locate your state's health department, and many state websites also have listings of all of their local health departments. ° °3) Find a Walk-in Clinic °If your illness is not likely to be very severe or complicated, you may want to try a walk in clinic. These are popping up all over the country in pharmacies, drugstores, and shopping centers. They're usually staffed by nurse practitioners or physician assistants that have been trained to treat common illnesses and complaints. They're usually fairly quick and inexpensive. However, if you have serious medical issues or chronic medical problems, these are probably not your best option. ° °No Primary Care Doctor: °- Call [facility] at  832-8000 - they can help you locate a primary care doctor that  accepts your insurance, provides certain services, etc. °- Physician Referral Service- 1-800-533-3463 ° °Chronic Pain Problems: °Organization         Address  Phone   Notes  °[facility]  (336) 297-2271 Patients need to be referred by their primary care doctor.  ° °Medication  Assistance: °Organization         Address  Phone   Notes  °[place] [facility] [address] °[place], [place] [address] (336) 641-8030 --Must be a resident of [place] °-- Must have NO insurance coverage whatsoever (no Medicaid/ Medicare, etc.) °-- The pt. MUST have a primary care doctor that directs their care regularly and follows them in the community °  °[facility]  (866) 331-1348   °[employer]  (888) 892-1162   ° °Agencies that provide inexpensive medical care: °Organization         Address  Phone   Notes  °[facility]  (336) 832-8035   °[facility]    (336) 832-7272   °[facility] [address] °[place], [place] [address] (336) 832-4777   °[facility] [address], °[place] (336) 271-4999   °[facility]    (336) 373-0678   °[facility]    (336) 272-1050   °[facility] ° [address], [place] Phone:  (336) 832-4444, Fax:  (336) 832-4440 Hours of Operation:  9 am - 6 pm, M-F.  Also accepts Medicaid/Medicare and self-pay.  °[facility] ° [address], [place] Phone: (336) 832-3150, Fax: (336) [phone]. Hours of Operation:  8:30 am - 5:30 pm, M-F.  Also accepts Medicaid and self-pay.  °[facility] [place] [address], [place] Phone: (336) 878-6027   °[facility] [address], [place], [place] (336)723-1848, Ext. 123 Mondays & Thursdays: 7-9 AM.  First 15 patients are seen on a first come, first serve basis. °  ° °Medicaid-accepting [place] Providers: ° °Organization         Address  Phone   Notes  °[facility] [address], [place] (336) 641-2100 Also accepts self-pay patients.  °[facility] [address], [place] ° (336) 856-9996   °[facility] [address], [place]  (336) 288-8857   °[facility] [address] [place] [address], [place] (336) 299-7000   °Veita Bland [address], [place]  ° (336) 373-1557 Only accepts [place] Access Medicaid patients after they have their name applied to their card.  ° °Self-Pay (no insurance) in [place]: ° °Organization         Address  Phone   Notes  °Sickle Cell Patients, [facility] [address], [place] (336) 832-1970   °[facility] [address], [place] (336) 832-4400   °[facility] ° [address], [place] (336) 992-4800   °[facility]/Dr. Osei-Bonsu ° [address] [place] [address], [place] or [address], [place] (336) 841-8500 Phone number for both [place] and [place] locations is the same.  °[facility] [address], [place] (336) 299-0000   °[facility] [address] [place] [address], [place] or [address] (336) 852-7530 °(336) 878-2260   °[facility] [address], [place] (336) 350-1642, phone; (336) 294-5005, fax Sees patients 1st and 3rd Saturday of every month.  Must not qualify for public or private insurance (i.e. Medicaid, Medicare, [place] Health Choice, Veterans' Benefits) • Household income should be no more than 200% of the poverty level •The clinic cannot treat you if you are pregnant or think you are pregnant • Sexually transmitted diseases are not treated at the clinic.  ° ° °Dental Care: °Organization         Address  Phone  Notes  °[place] [facility] [address], [place] (336) 641-6152 Accepts children up to age 21 who are enrolled in Medicaid or [place] Health Choice; pregnant women with a Medicaid card; and children who have applied for Medicaid or [place] Health Choice, but were declined, whose parents can pay a reduced fee at time of service.  °[place]  [facility] [place]  [address], [place] (336) 641-7733 Accepts children up to age 21 who are enrolled in Medicaid or [place] Health Choice; pregnant women with a Medicaid card; and children who have applied for Medicaid or [place] Health Choice, but were declined, whose parents can pay a reduced fee at time of service.  °[facility] ° [address], [place] (336) 641-4533 Patients are seen by appointment only. Walk-ins are not accepted. [facility] will see patients 18 years of age and older. °Monday - Tuesday (8am-5pm) °Most Wednesdays (8:30-5pm) °$30 per visit, cash only  °[facility] ° [address], [place] (336) 641-4533 Patients are seen by appointment only. Walk-ins are not accepted. [facility] will see patients 18 years of age and older. °One   Wednesday Evening (Monthly: Volunteer Based).  $30 per visit, cash only  °[facility]  (919) 537-3737 for adults; Children under age 4, call Graduate Pediatric Dentistry at ([phone]) [phone]. Children aged 4-14, please call ([phone]) [phone] to request a pediatric application. ° Dental services are provided in all areas of dental care including fillings, crowns and bridges, complete and partial dentures, implants, gum treatment, root canals, and extractions. Preventive care is also provided. Treatment is provided to both adults and children. °Patients are selected via a lottery and there is often a waiting list. °  °[facility] [address], °[place] ° (336) 763-8833 [email or web address] °  °[facility] [address], [place], [place] (336)723-1848, Ext. 123 Second and Fourth Thursday of each month, opens at 6:30 AM; Clinic ends at 9 AM.  Patients are seen on a first-come first-served basis, and a limited number are seen during each clinic.  ° °[facility] ° [address], [place], [place] (336) 723-7904    Eligibility Requirements °You must have lived in [place], [place], or [place] counties for at least the last three months. °  You cannot be eligible for state or federal sponsored healthcare insurance, including Veterans Administration, Medicaid, or Medicare. °  You generally cannot be eligible for healthcare insurance through your employer.  °  How to apply: °Eligibility screenings are held every Tuesday and Wednesday afternoon from 1:00 pm until 4:00 pm. You do not need an appointment for the interview!  °[facility] [address], [place], [place] 336-631-2330   °[facility]  336-342-8273   °[facility]  336-703-3100   °[facility]  336-570-6415   ° °Behavioral Health Resources in the Community: °Intensive Outpatient Programs °Organization         Address  Phone  Notes  °[facility] [address], [place], [place] 336-878-6098   °[facility] [address], [place], [place] 336-832-9800   °[facility] [address], [place], [place] ° 336-882-2125   °[facility] [address],  °[place], [place] 1-800-853-5163 or 336-641-4981   °Substance Abuse Resources °Organization         Address  Phone  Notes  °[facility]  336-882-2125   °[facility]  336-784-9470   °[facility]  336-285-9073   °[facility]  336-845-3988   °Residential & Outpatient Substance Abuse Program  1-800-659-3381   °Psychological Services °Organization         Address  Phone  Notes  °[facility]  336- 832-9600   °[facility]  336- 378-7881   °[facility] [address], [place] 1-800-853-5163 or 336-641-4981   ° °Mobile Crisis Teams °Organization         Address  Phone  Notes  °[facility], Mobile Crisis Care Unit  1-877-626-1772   °Assertive °[facility] ° [address]  [place], [place] 336-834-9664   °Sharon DeEsch [address] °[place] 336-554-5454   ° °Self-Help/Support Groups °Organization         Address  Phone             Notes  °[facility] of  - variety of support groups  336- 373-1402 Call for more information  °Narcotics Anonymous (NA), [facility] [address], °[place]  2 meetings at this location  ° °  Residential Treatment Programs Organization         Address  Phone  Notes  [facility] [address],    [place]  [phone]   [facility]  [address], [place] [phone], [place], [place] [phone]   [facility] [address], [place] [phone] Admissions: 8am-3pm M-F  [facility] [address]    [place], [place] [phone]   [facility] [address], [place], [place] [phone]   [facility] [address]  [place], [place] [phone]) [phone]   [facility] Intensive Outpatient [address], [place], [place] [phone]   [facility] ([facility] [address]  [place], [place] [phone] or [phone])[phone]   [facility]) [address] [place], [place] [phone] Accepts Medicaid  [facility] [address]  [place] [phone] Substance Abuse/Addiction Treatment   [facility]         Address  Phone  Notes  [facility]  (906)763-1932(888) [phone]   Angie FavaJulie Brannon, PhD [address], [address] [place], [place]   ([phone](336) [phone] or [phone](336) [phone]   [facility]   [address] [place], [place] [phone]) [phone]   [facility] [address], [place], [place] [phone](336) [phone] Insurance/Medicaid/sponsorship through [facility] [address]                                    [place], [place] [phone]) [phone] Therapy/tele-psych/case    [facility] [address]   [place], [place] [phone]) [phone]    Dr. Lolly MustacheArfeen  [phone]   [facility] [place]  [facility] 1) [address], [place] 2) [address], [place] 3)  [address], [place] [phone](336) [phone](336) [phone]  [phone]) [phone](336) [phone]   [facility] [place] Child Abuse Hotline 202 472 4206(336) 615-803-2903 or (580) [phone](336) (365) [phone] (After Hours)       Avoid avoid caffinated products, such as teas, colas, coffee, chocolate. Avoid over the counter cold medicines, herbal or "natural vitamin" products, and illicit drugs because they can contain stimulants.  Call your regular medical doctor tomorrow morning to schedule a follow up appointment within the next 2 days. Call the Cardiologist tomorrow to schedule a follow up appointment within the next 1 week.  Return to the Emergency Department immediately if worsening.

## 2014-01-08 NOTE — ED Notes (Signed)
Patient placed on continuous cardiac monitoring, continuous pulse 0x monitoring 

## 2014-01-08 NOTE — ED Notes (Signed)
Pt reports was sitting in her car doing some paperwork and had sudden onset of chest pain and SOB.  Reports feels like heart is fluttering.  Reports has had increased heart rate for the past month.

## 2014-04-27 ENCOUNTER — Telehealth: Payer: Self-pay | Admitting: Nurse Practitioner

## 2014-04-27 NOTE — Telephone Encounter (Signed)
Diabetic bundle LDL-patient will CB to schedule

## 2015-04-08 ENCOUNTER — Other Ambulatory Visit: Payer: Self-pay

## 2018-03-22 ENCOUNTER — Other Ambulatory Visit: Payer: Self-pay | Admitting: Gastroenterology

## 2018-03-22 ENCOUNTER — Ambulatory Visit (HOSPITAL_COMMUNITY): Payer: Self-pay

## 2018-03-22 DIAGNOSIS — R1011 Right upper quadrant pain: Secondary | ICD-10-CM

## 2018-03-23 ENCOUNTER — Ambulatory Visit (HOSPITAL_COMMUNITY): Payer: BLUE CROSS/BLUE SHIELD

## 2018-03-24 ENCOUNTER — Ambulatory Visit (HOSPITAL_COMMUNITY): Payer: BLUE CROSS/BLUE SHIELD

## 2019-06-27 ENCOUNTER — Ambulatory Visit (HOSPITAL_COMMUNITY)
Admission: EM | Admit: 2019-06-27 | Discharge: 2019-06-27 | Disposition: A | Discharge status: Home / Self Care | Payer: BC Managed Care – PPO | Attending: Urgent Care | Admitting: Urgent Care

## 2019-06-27 ENCOUNTER — Other Ambulatory Visit: Payer: Self-pay

## 2019-06-27 ENCOUNTER — Encounter (HOSPITAL_COMMUNITY): Payer: Self-pay

## 2019-06-27 ENCOUNTER — Ambulatory Visit (HOSPITAL_COMMUNITY): Admission: EM | Admit: 2019-06-27 | Discharge: 2019-06-27 | Discharge status: Absent without leave | Payer: Self-pay

## 2019-06-27 ENCOUNTER — Ambulatory Visit (INDEPENDENT_AMBULATORY_CARE_PROVIDER_SITE_OTHER): Payer: BC Managed Care – PPO

## 2019-06-27 DIAGNOSIS — M25472 Effusion, left ankle: Secondary | ICD-10-CM

## 2019-06-27 DIAGNOSIS — S93402A Sprain of unspecified ligament of left ankle, initial encounter: Secondary | ICD-10-CM | POA: Diagnosis not present

## 2019-06-27 DIAGNOSIS — M25572 Pain in left ankle and joints of left foot: Secondary | ICD-10-CM | POA: Diagnosis not present

## 2019-06-27 DIAGNOSIS — X501XXA Overexertion from prolonged static or awkward postures, initial encounter: Secondary | ICD-10-CM | POA: Diagnosis not present

## 2019-06-27 DIAGNOSIS — S99912A Unspecified injury of left ankle, initial encounter: Secondary | ICD-10-CM

## 2019-06-27 MED ORDER — MELOXICAM 7.5 MG PO TABS: 7.5000 mg | ORAL_TABLET | Freq: Every day | ORAL | 0 refills | Status: AC

## 2019-06-27 NOTE — ED Provider Notes (Signed)
MRN: [id number] DOB: 1965/10/20  Subjective:   Rose Price is a 53 y.o. female presenting for acute onset of left ankle pain, swelling from an injury last night.  Patient states that she was walking on uneven pavement, rolled her ankle outwardly and has since had persistent sharp intermittent pain.  She is significant difficulty bearing weight on it but is able to walk with a limp.  Patient has well-controlled diabetes, denies history of liver or kidney disease.  No current facility-administered medications for this encounter.   Current Outpatient Medications:  .  diphenhydramine-acetaminophen (TYLENOL PM) 25-500 MG TABS, Take 1 tablet by mouth at bedtime as needed (for sleep)., Disp: , Rfl:  .  insulin detemir (LEVEMIR) 100 UNIT/ML injection, Inject 10 Units into the skin at bedtime., Disp: , Rfl:  .  Melatonin 10 MG CAPS, Take 1 capsule by mouth daily as needed (for sleep.)., Disp: , Rfl:  .  metFORMIN (GLUCOPHAGE) 1000 MG tablet, Take 1 tablet (1,000 mg total) by mouth 2 (two) times daily with a meal., Disp: 60 tablet, Rfl: 2 .  Multiple Vitamins-Minerals (HAIR/SKIN/NAILS) TABS, Take 2 tablets by mouth daily., Disp: , Rfl:  .  oxyCODONE-acetaminophen (PERCOCET/ROXICET) 5-325 MG per tablet, Take 1 tablet by mouth every 6 (six) hours as needed for severe pain., Disp: 30 tablet, Rfl: 0 .  phentermine (ADIPEX-P) 37.5 MG tablet, Take 18.75 mg by mouth daily as needed (for appetite)., Disp: , Rfl:  .  Vitamin D, Ergocalciferol, (DRISDOL) 50000 UNITS CAPS capsule, Take 50,000 Units by mouth every 7 (seven) days., Disp: , Rfl:  .  zolpidem (AMBIEN) 10 MG tablet, Take 10 mg by mouth at bedtime as needed for sleep., Disp: , Rfl:    Allergies  Allergen Reactions  . Codeine Itching    Past Medical History:  Diagnosis Date  . Diabetes mellitus without complication (HCC)   . Tubal ectopic pregnancy 1999   L fallopian tube removed     Past Surgical History:  Procedure Laterality Date  . KNEE  ARTHROSCOPY W/ MENISCAL REPAIR Right   . REPAIR NONUNION / DEFECT OF RADIUS / ULNA Right    has hardware  . SALPINGECTOMY Right     ROS  Objective:   Vitals: BP 102/66 (BP Location: Right Arm)   Pulse 80   Resp 18   LMP 12/09/2013   SpO2 100%   Physical Exam Constitutional:      General: She is not in acute distress.    Appearance: Normal appearance. She is well-developed. She is not ill-appearing.  HENT:     Head: Normocephalic and atraumatic.     Nose: Nose normal.     Mouth/Throat:     Mouth: Mucous membranes are moist.     Pharynx: Oropharynx is clear.  Eyes:     General: No scleral icterus.    Extraocular Movements: Extraocular movements intact.     Pupils: Pupils are equal, round, and reactive to light.  Cardiovascular:     Rate and Rhythm: Normal rate.  Pulmonary:     Effort: Pulmonary effort is normal.  Musculoskeletal:     Left ankle: She exhibits decreased range of motion and swelling. She exhibits no ecchymosis, no deformity, no laceration and normal pulse. Tenderness. Lateral malleolus and AITFL tenderness found. No medial malleolus, no CF ligament, no posterior TFL, no head of 5th metatarsal and no proximal fibula tenderness found. Achilles tendon exhibits no pain and no defect.  Skin:    General: Skin is warm and dry.  Neurological:     General: No focal deficit present.     Mental Status: She is alert and oriented to person, place, and time.  Psychiatric:        Mood and Affect: Mood normal.        Behavior: Behavior normal.     Dg Ankle Complete Left  Result Date: 06/27/2019 CLINICAL DATA:  Left ankle pain due to a twisting injury on uneven pavement. Lateral pain and swelling. Initial encounter. EXAM: LEFT ANKLE COMPLETE - 3+ VIEW COMPARISON:  None. FINDINGS: Lateral soft tissues are swollen. No fracture or dislocation. Small lucency in the periphery of the lateral talar dome is consistent with an osteochondral lesion, chronic. IMPRESSION: Lateral  soft tissue swelling without underlying fracture. Small osteochondral lesion lateral talar dome appears chronic. Electronically Signed   By: Inge Rise M.D.   On: 06/27/2019 18:56   Patient's left ankle was wrapped using an Ace wrap and figure-of-eight fashion.  Assessment and Plan :   1. Acute left ankle pain   2. Left ankle swelling   3. Sprain of left ankle, unspecified ligament, initial encounter     Patient will use rice method, meloxicam for management of her left ankle sprain.  Follow-up with orthopedist in 1 to 2 weeks if symptoms persist. Counseled patient on potential for adverse effects with medications prescribed/recommended today, ER and return-to-clinic precautions discussed, patient verbalized understanding.    Jaynee Eagles, [place] 06/27/19 1928

## 2019-06-27 NOTE — ED Triage Notes (Signed)
Pt states she has rolled her left ankle last night.

## 2020-07-22 IMAGING — DX DG ANKLE COMPLETE 3+V*L*
3 series · 3 of 3 positions shown · non-contrast
Comparison: None.

CLINICAL DATA: Left ankle pain due to a twisting injury on uneven
pavement. Lateral pain and swelling. Initial encounter.

EXAM:
LEFT ANKLE COMPLETE - 3+ VIEW

[ankle ap]
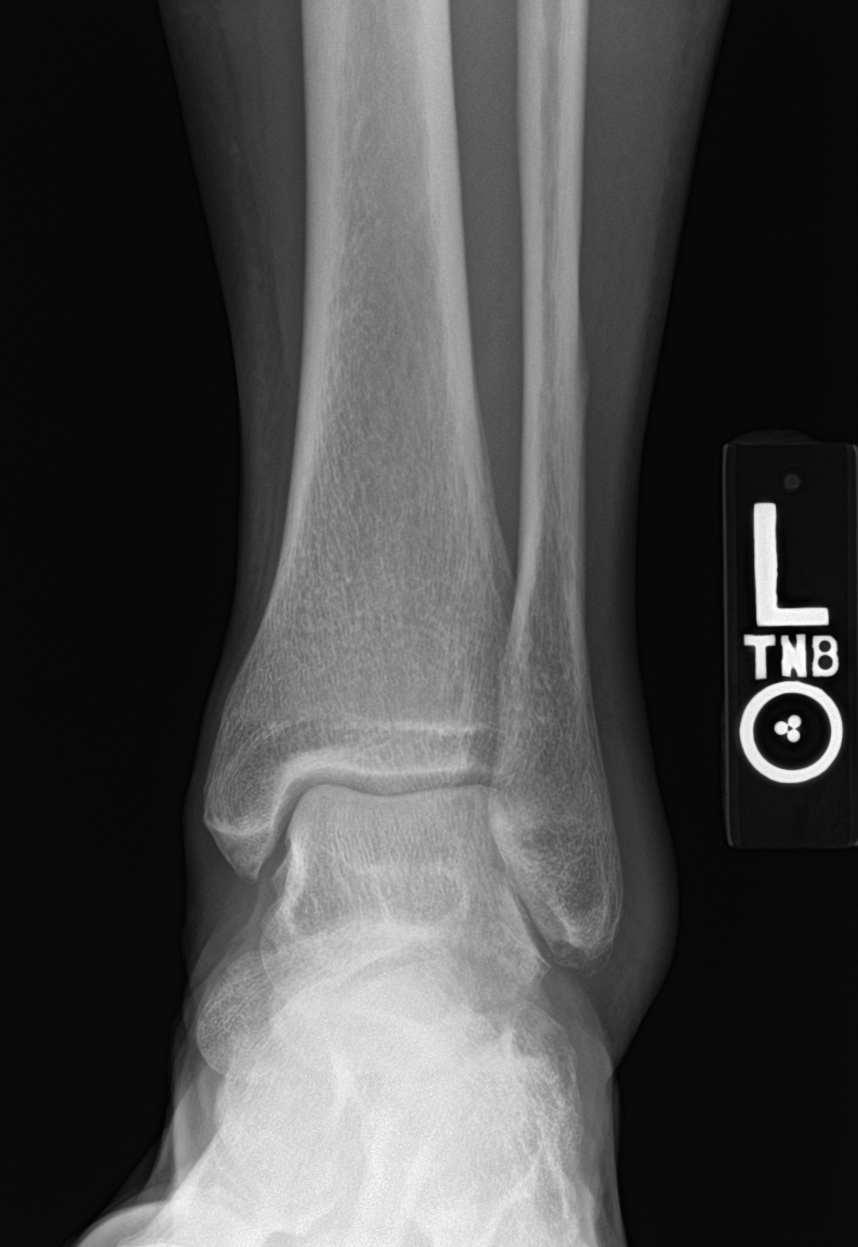

[ankle obl]
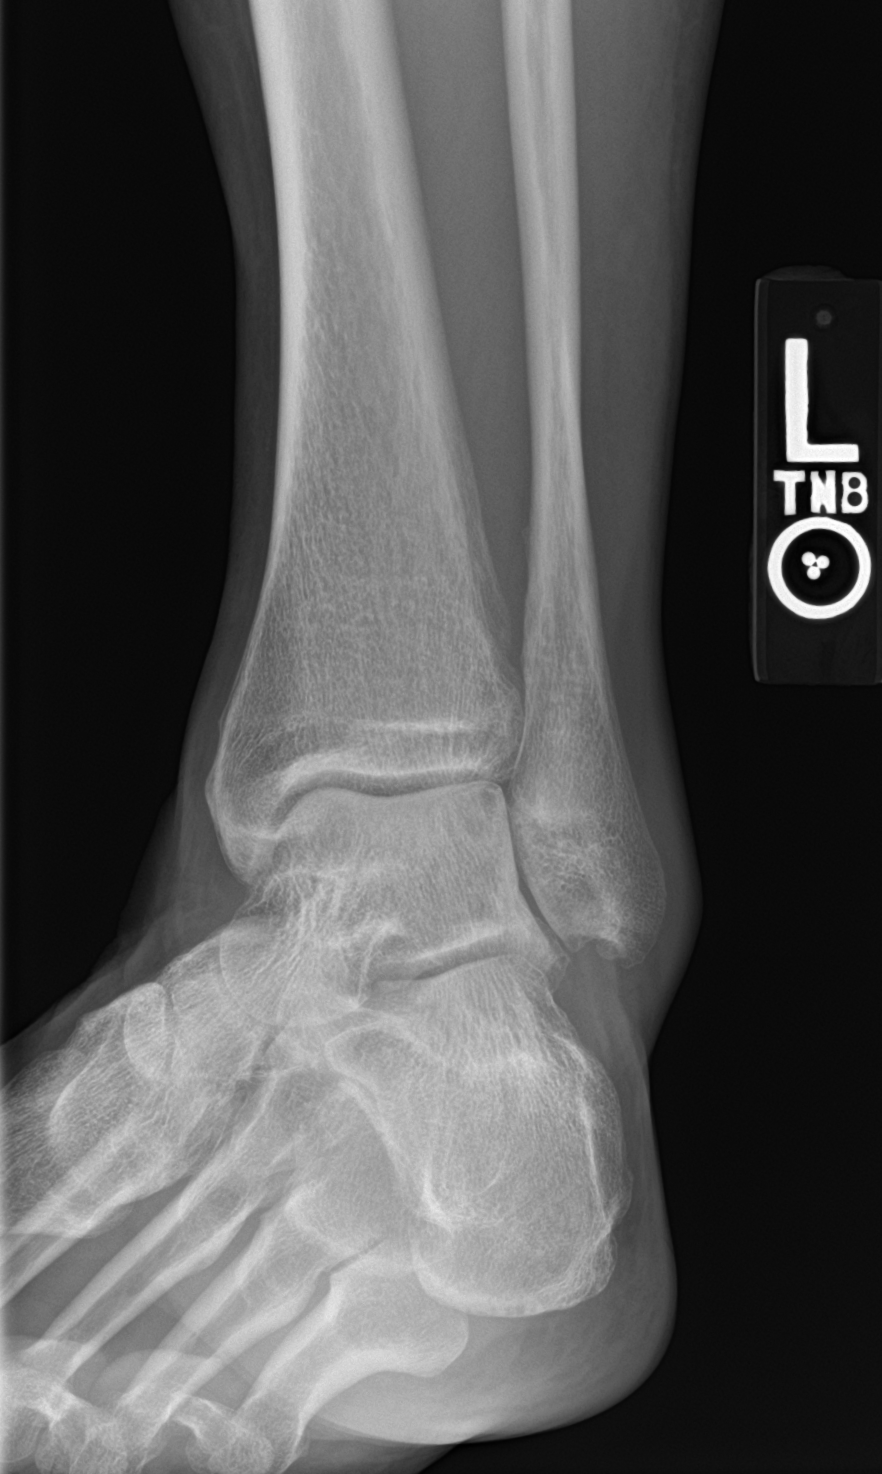

[ankle lat]
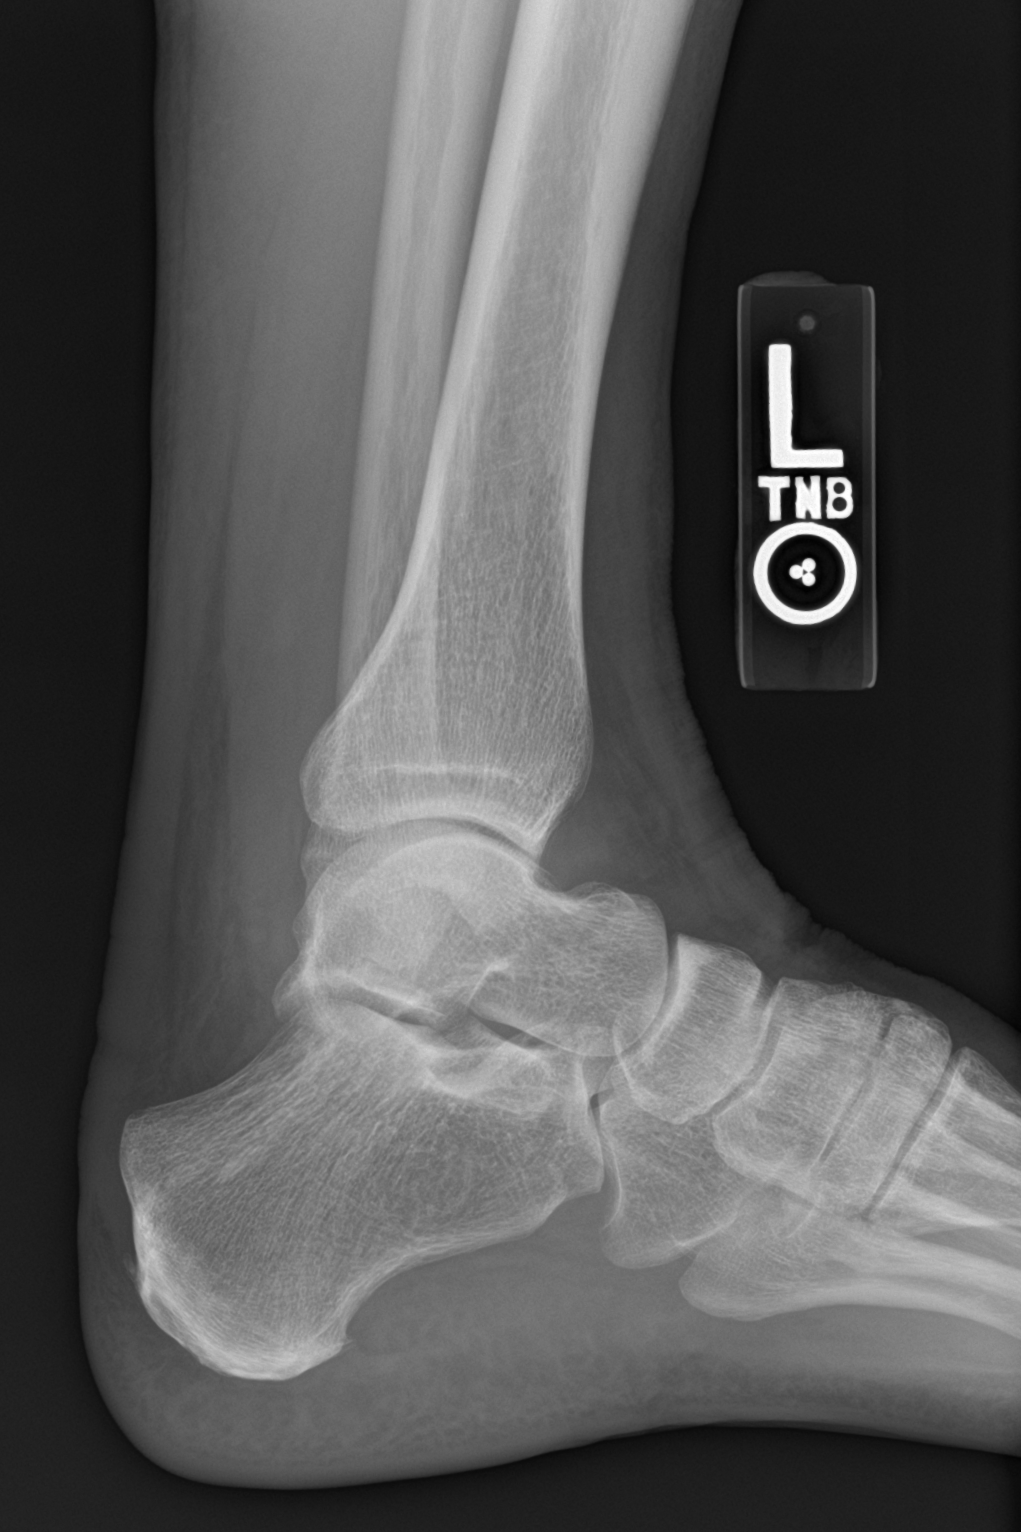

[3 of 3 positions shown; findings below may reference images not displayed]

FINDINGS: Lateral soft tissues are swollen. No fracture or dislocation. Small
lucency in the periphery of the lateral talar dome is consistent
with an osteochondral lesion, chronic.
IMPRESSION: Lateral soft tissue swelling without underlying fracture.

Small osteochondral lesion lateral talar dome appears chronic.

## 2022-02-25 ENCOUNTER — Emergency Department (INDEPENDENT_AMBULATORY_CARE_PROVIDER_SITE_OTHER)
Admission: EM | Admit: 2022-02-25 | Discharge: 2022-02-25 | Disposition: A | Discharge status: Home / Self Care | Payer: 59 | Source: Home / Self Care | Attending: Family Medicine | Admitting: Family Medicine

## 2022-02-25 DIAGNOSIS — M51369 Other intervertebral disc degeneration, lumbar region without mention of lumbar back pain or lower extremity pain: Secondary | ICD-10-CM

## 2022-02-25 DIAGNOSIS — M5431 Sciatica, right side: Secondary | ICD-10-CM

## 2022-02-25 DIAGNOSIS — M5441 Lumbago with sciatica, right side: Secondary | ICD-10-CM

## 2022-02-25 DIAGNOSIS — M5136 Other intervertebral disc degeneration, lumbar region: Secondary | ICD-10-CM | POA: Diagnosis not present

## 2022-02-25 MED ORDER — PREDNISONE 20 MG PO TABS: 20.0000 mg | ORAL_TABLET | Freq: Two times a day (BID) | ORAL | 0 refills | Status: AC

## 2022-02-25 MED ORDER — OXYCODONE-ACETAMINOPHEN 5-325 MG PO TABS: 1.0000 | ORAL_TABLET | Freq: Four times a day (QID) | ORAL | 0 refills | Status: AC | PRN

## 2022-02-25 NOTE — ED Triage Notes (Signed)
Pt c/o back pain x 2 days. Hx of bulging discs and sciatic. Pain mostly on RT side, radiating to buttock. Went to ED yesterday, rx'd flexeril and also Toradol shot. No improvement.  ?

## 2022-02-25 NOTE — Discharge Instructions (Signed)
Activity as tolerated.  Gently stretch as much as you are able ?Ice or heat to painful back area ?Take prednisone daily as directed.  Be careful with your diabetic diet while on prednisone ?Take Percocet (oxycodone) as needed for severe pain.  Do not drive on the oxycodone.  If no relief after 1 hour may take a second pill.  No more than 2 pills 3 times a day ?May take the Flexeril as needed for muscle relaxer.  This is helpful at bedtime ?See your doctor if not improving by the end of the week ?

## 2022-02-25 NOTE — ED Provider Notes (Signed)
?[place] ? ? ? ?CSN: [id number] ?Arrival date & time: 02/25/22  1545 ? ? ?  ? ?History   ?Chief Complaint ?Chief Complaint  ?Patient presents with  ? Back Pain  ? ? ?HPI ?Rose Price is a 56 y.o. female.  ? ?HPI ? ?Patient has known lumbar DJD.  No imaging for years.  Periodically has low back pain usu with right radiculopathy. Her husband uses a wheelchair.  She was lifting the chair into her vehicle and felt a pain in her low back.  Went to the ER yesterday for severe pain and pain into the right leg.  It travels from buttock to the right little toe.  No numbness or weakness.  no bowel or bladder complaint. ? ?Past Medical History:  ?Diagnosis Date  ? Diabetes mellitus without complication ([place])   ? Tubal ectopic pregnancy 1999  ? L fallopian tube removed  ? ? ?Patient Active Problem List  ? Diagnosis Date Noted  ? Diabetes ([place]) 08/15/2013  ? ? ?Past Surgical History:  ?Procedure Laterality Date  ? KNEE ARTHROSCOPY W/ MENISCAL REPAIR Right   ? REPAIR NONUNION / DEFECT OF RADIUS / ULNA Right   ? has hardware  ? SALPINGECTOMY Right   ? ? ?OB History   ?No obstetric history on file. ?  ? ? ? ?Home Medications   ? ?Prior to Admission medications   ?Medication Sig Start Date End Date Taking? Authorizing Provider  ?oxyCODONE-acetaminophen (PERCOCET/ROXICET) 5-325 MG tablet Take 1 tablet by mouth every 6 (six) hours as needed for severe pain. 02/25/22  Yes Raylene Everts, MD  ?predniSONE (DELTASONE) 20 MG tablet Take 1 tablet (20 mg total) by mouth 2 (two) times daily with a meal. 02/25/22  Yes Raylene Everts, MD  ?Semaglutide,0.25 or 0.5MG /DOS, (OZEMPIC, 0.25 OR 0.5 MG/DOSE,) 2 MG/3ML SOPN INJECT ONE QUARTER MG (0.25MG ) INTO THE SKIN ONCE A WEEK 02/23/22  Yes [provider]  ?buPROPion (WELLBUTRIN) 100 MG tablet Take by mouth.    [provider]  ?diphenhydramine-acetaminophen (TYLENOL PM) 25-500 MG TABS Take 1 tablet by mouth at bedtime as needed (for sleep).    [provider]  ?Melatonin 10 MG CAPS Take 1 capsule by mouth daily as needed (for sleep.).    [provider]  ?meloxicam (MOBIC) 7.5 MG tablet Take 1 tablet (7.5 mg total) by mouth daily. 06/27/19   Jaynee Eagles, PA-C  ?Multiple Vitamins-Minerals (HAIR/SKIN/NAILS) TABS Take 2 tablets by mouth daily.    [provider]  ?pravastatin (PRAVACHOL) 40 MG tablet Take 40 mg by mouth daily. 01/27/22   [provider]  ?SYNJARDY XR 12.02-999 MG TB24 Take 1 tablet by mouth 2 (two) times daily. 02/23/22   [provider]  ?Vitamin D, Ergocalciferol, (DRISDOL) 50000 UNITS CAPS capsule Take 50,000 Units by mouth every 7 (seven) days.    [provider]  ?zolpidem (AMBIEN) 10 MG tablet Take 10 mg by mouth at bedtime as needed for sleep.    [provider]  ? ? ?Family History ?Family History  ?Problem Relation Age of Onset  ? Arthritis Mother   ? Hyperlipidemia Mother   ? Leukemia Father   ? Irritable bowel syndrome Sister   ? Diabetes Sister   ? Anxiety disorder Sister   ? Cancer Paternal Aunt   ?     ovarian  ? ? ?Social History ?Social History  ? ?Tobacco Use  ? Smoking status: Never  ? Smokeless tobacco: Never  ?Substance  Use Topics  ? Alcohol use: No  ? Drug use: No  ? ? ? ?Allergies   ?Codeine ? ? ?Review of Systems ?Review of Systems ? ?See HPI ?Physical Exam ?Triage Vital Signs ?ED Triage Vitals  ?Enc Vitals Group  ?   BP 02/25/22 1605 120/76  ?   Pulse Rate 02/25/22 1605 (!) 111  ?   Resp 02/25/22 1605 19  ?   Temp 02/25/22 1605 98.2 ?F (36.8 ?C)  ?   Temp Source 02/25/22 1605 Oral  ?   SpO2 02/25/22 1605 97 %  ?   Weight --   ?   Height --   ?   Head Circumference --   ?   Peak Flow --   ?   Pain Score 02/25/22 1606 10  ?   Pain Loc --   ?   Pain Edu? --   ?   Excl. in [place]? --   ? ?No data found. ? ?Updated Vital Signs ?BP 120/76 (BP Location: Right Arm)   Pulse (!) 111   Temp 98.2 ?F (36.8 ?C) (Oral)   Resp 19   LMP 12/09/2013   SpO2 97%  ?   ? ?Physical  Exam ?Constitutional:   ?   General: She is in acute distress.  ?   Appearance: She is well-developed and normal weight.  ?HENT:  ?   Head: Normocephalic and atraumatic.  ?Eyes:  ?   Conjunctiva/sclera: Conjunctivae normal.  ?   Pupils: Pupils are equal, round, and reactive to light.  ?Cardiovascular:  ?   Rate and Rhythm: Normal rate.  ?Pulmonary:  ?   Effort: Pulmonary effort is normal. No respiratory distress.  ?Abdominal:  ?   General: There is no distension.  ?   Palpations: Abdomen is soft.  ?Musculoskeletal:     ?   General: Tenderness present. No swelling or deformity. Normal range of motion.  ?   Cervical back: Normal range of motion.  ?   Right lower leg: No edema.  ?   Left lower leg: No edema.  ?   Comments: Very uncomfortable due to acute pain.  Guarded movements.  Decreased ROM.  SLR is positive.  NO focal neuro deficit  ?Skin: ?   General: Skin is warm and dry.  ?Neurological:  ?   Mental Status: She is alert.  ? ? ? ?UC Treatments / Results  ?Labs ?(all labs ordered are listed, but only abnormal results are displayed) ?Labs Reviewed - No data to display ? ?EKG ? ? ?Radiology ?No results found. ? ?Procedures ?Procedures (including critical care time) ? ?Medications Ordered in UC ?Medications - No data to display ? ?Initial Impression / Assessment and Plan / UC Course  ?I have reviewed the triage vital signs and the nursing notes. ? ?Pertinent labs & imaging results that were available during my care of the patient were reviewed by me and considered in my medical decision making (see chart for details). ? ?  ? ?Final Clinical Impressions(s) / UC Diagnoses  ? ?Final diagnoses:  ?Lumbar degenerative disc disease  ?Right sided sciatica  ?Acute right-sided low back pain with right-sided sciatica  ? ? ? ?Discharge Instructions   ? ?  ?Activity as tolerated.  Gently stretch as much as you are able ?Ice or heat to painful back area ?Take prednisone daily as directed.  Be careful with your diabetic diet while  on prednisone ?Take Percocet (oxycodone) as needed for severe pain.  Do not  drive on the oxycodone.  If no relief after 1 hour may take a second pill.  No more than 2 pills 3 times a day ?May take the Flexeril as needed for muscle relaxer.  This is helpful at bedtime ?See your doctor if not improving by the end of the week ? ? ?ED Prescriptions   ? ? Medication Sig Dispense Auth. Provider  ? predniSONE (DELTASONE) 20 MG tablet Take 1 tablet (20 mg total) by mouth 2 (two) times daily with a meal. 10 tablet Raylene Everts, MD  ? oxyCODONE-acetaminophen (PERCOCET/ROXICET) 5-325 MG tablet Take 1 tablet by mouth every 6 (six) hours as needed for severe pain. 10 tablet Raylene Everts, MD  ? ?  ? ?I have reviewed the PDMP during this encounter. ?  ?Raylene Everts, MD ?02/25/22 1654 ? ?
